# Patient Record
Sex: Male | Born: 1990 | Race: Black or African American | Hispanic: No | Marital: Single | Smoking: Never smoker
Health system: Southern US, Community
[De-identification: ages and names within clinical notes are randomized; demographics above are authoritative.]

## PROBLEM LIST (undated history)

## (undated) DIAGNOSIS — K219 Gastro-esophageal reflux disease without esophagitis: Secondary | ICD-10-CM

## (undated) DIAGNOSIS — B159 Hepatitis A without hepatic coma: Secondary | ICD-10-CM

## (undated) DIAGNOSIS — F419 Anxiety disorder, unspecified: Secondary | ICD-10-CM

## (undated) DIAGNOSIS — F329 Major depressive disorder, single episode, unspecified: Secondary | ICD-10-CM

## (undated) DIAGNOSIS — B20 Human immunodeficiency virus [HIV] disease: Secondary | ICD-10-CM

## (undated) DIAGNOSIS — F32A Depression, unspecified: Secondary | ICD-10-CM

## (undated) DIAGNOSIS — R011 Cardiac murmur, unspecified: Secondary | ICD-10-CM

## (undated) HISTORY — DX: Gastro-esophageal reflux disease without esophagitis: K21.9

## (undated) HISTORY — DX: Anxiety disorder, unspecified: F41.9

## (undated) HISTORY — PX: APPENDECTOMY: SHX54

## (undated) HISTORY — DX: Major depressive disorder, single episode, unspecified: F32.9

## (undated) HISTORY — DX: Depression, unspecified: F32.A

---

## 2017-04-18 ENCOUNTER — Encounter: Payer: Self-pay | Admitting: Emergency Medicine

## 2017-04-18 ENCOUNTER — Emergency Department
Admission: EM | Admit: 2017-04-18 | Discharge: 2017-04-18 | Disposition: A | Discharge status: Home / Self Care | Payer: Self-pay | Attending: Student in an Organized Health Care Education/Training Program | Admitting: Student in an Organized Health Care Education/Training Program

## 2017-04-18 DIAGNOSIS — K047 Periapical abscess without sinus: Secondary | ICD-10-CM | POA: Insufficient documentation

## 2017-04-18 MED ORDER — MAGIC MOUTHWASH W/LIDOCAINE: 5.0000 mL | Freq: Four times a day (QID) | ORAL | 0 refills | Status: DC

## 2017-04-18 MED ORDER — AMOXICILLIN 875 MG PO TABS: 875.0000 mg | ORAL_TABLET | Freq: Two times a day (BID) | ORAL | 0 refills | Status: DC

## 2017-04-18 NOTE — Discharge Instructions (Signed)
OPTIONS FOR DENTAL FOLLOW UP CARE ° °[place] Department of Health and Human Services - Local Safety Net Dental Clinics °http://www.ncdhhs.gov/dph/oralhealth/services/safetynetclinics.htm °  °[facility] (336-562-3123) ° °[facility] [place] (919-933-9087) ° °[facility] (919-663-1744 ext [phone]) ° °[facility] ([phone]) ° °[facility] ([phone]) °This clinic caters to the indigent population and is on a lottery system. °Location: °[facility], [address], [place] °Clinic Hours: °Wednesdays from 6pm - 9pm, patients seen by a lottery system. °For dates, call or go to [email or web address].[facility].edu/[facility]/patients/Dental-[facility] °Services: °Cleanings, fillings and simple extractions. °Payment Options: °DENTAL WORK IS FREE OF CHARGE. Bring proof of income or support. °Best way to get seen: °Arrive at 5:15 pm - this is a lottery, NOT first come/first serve, so arriving earlier will not increase your chances of being seen. °  °  °[facility] °919-537-3737 °Select option 1 for emergencies °  °Location: °[facility], [address], [place] °Clinic Hours: °No walk-ins accepted - call the day before to schedule an appointment. °Check in times are 9:30 am and 1:30 pm. °Services: °Simple extractions, temporary fillings, pulpectomy/pulp debridement, uncomplicated abscess drainage. °Payment Options: °PAYMENT IS DUE AT THE TIME OF SERVICE.  Fee is usually $100-200, additional surgical procedures (e.g. abscess drainage) may be extra. °Cash, checks, Visa/MasterCard accepted.  Can file Medicaid if patient is covered for dental - patient should call case worker to check. °No discount for [facility] patients. °Best way to get seen: °MUST call the day before and get onto the schedule. Can usually be seen the next 1-2 days. No walk-ins accepted. °  °  °[facility] °[phone] °   °Location: °[facility], [address], [place] °Clinic Hours: °M, W, Th, F 8am or 1:30pm, Tues 9a or 1:30 - first come/first served. °Services: °Simple extractions, temporary fillings, uncomplicated abscess drainage.  You do not need to be an [place] resident. °Payment Options: °PAYMENT IS DUE AT THE TIME OF SERVICE. °Dental insurance, otherwise sliding scale - bring proof of income or support. °Depending on income and treatment needed, cost is usually $50-200. °Best way to get seen: °Arrive early as it is first come/first served. °  °  °[facility] °[phone] °  °Location: °[address] °Clinic Hours: °Mon-Thu 8a-5p °Services: °Most basic dental services including extractions and fillings. °Payment Options: °PAYMENT IS DUE AT THE TIME OF SERVICE. °Sliding scale, up to 50% off - bring proof if income or support. °Medicaid with dental option accepted. °Best way to get seen: °Call to schedule an appointment, can usually be seen within 2 weeks OR they will try to see walk-ins - show up at 8a or 2p (you may have to wait). °  °  °[facility] °919-245-2435 °[place] RESIDENTS ONLY °  °Location: °[facility], [address], [place], [place] [address] °Clinic Hours: By appointment only. °Monday - Thursday 8am-5pm, Friday 8am-12pm °Services: Cleanings, fillings, extractions. °Payment Options: °PAYMENT IS DUE AT THE TIME OF SERVICE. °Cash, Visa or MasterCard. Sliding scale - $30 minimum per service. °Best way to get seen: °Come in to office, complete packet and make an appointment - need proof of income °or support monies for each household member and proof of [place] residence. °Usually takes about a month to get in. °  °  °[facility] °[phone] °  °Location: °[address]   [place] °Clinic Hours: Walk-in Urgent Care Dental Services are offered Monday-Friday  mornings only. °The numbers of emergencies accepted daily is limited to the number of °providers available. °Maximum 15 - Mondays, Wednesdays & Thursdays °Maximum 10 - Tuesdays & Fridays °Services: °You do not need to be a [place] resident to be seen for a dental emergency. °Emergencies are defined as pain, swelling, abnormal bleeding, or dental trauma. Walkins will receive x-rays if needed. °NOTE: Dental cleaning is not an emergency. °Payment Options: °PAYMENT IS DUE AT THE TIME OF SERVICE. °Minimum co-pay is $40.00 for uninsured patients. °Minimum co-pay is $3.00 for Medicaid with dental coverage. °Dental Insurance is accepted and must be presented at time of visit. °Medicare does not cover dental. °Forms of payment: Cash, credit card, checks. °Best way to get seen: °If not previously registered with the clinic, walk-in dental registration begins at 7:15 am and is on a first come/first serve basis. °If previously registered with the clinic, call to make an appointment. °  °  °[facility] °919-776-4359 °[place] RESIDENTS ONLY °  °Location: °[address], [place], [place] °Clinic Hours: °Mon-Thu 10a-2p °Services: Extractions only! °Payment Options: °FREE (donations accepted) - bring proof of income or support °Best way to get seen: °Call and schedule an appointment OR come at 8am on the 1st Monday of every month (except for holidays) when it is first come/first served. °  °  °[facility] °[phone] °  °Location: °[address], [place] °Clinic Hours: °Friday mornings °Services, Payment Options, Best way to get seen: °Call for info °

## 2017-04-18 NOTE — ED Triage Notes (Signed)
States he has a hole in his tooth which is causing him some pain

## 2017-04-18 NOTE — ED Provider Notes (Signed)
[facility] Emergency Department Provider Note  ____________________________________________  Time seen: Approximately 3:00 PM  I have reviewed the triage vital signs and the nursing notes.   HISTORY  Chief Complaint Dental Pain    HPI Jeremiah Reed is a 26 y.o. male who presents emergency department complaining of left lower dental pain. Patient reports that he has had "a hole" form along his gumline. Patient reports that this area was painful and has been increasing both in pain and size. Area is a sharp pain with eating. Patient denies any fevers or chills, difficulty breathing or swallowing. Patient does not have a dentist. No medications prior to arrival. No other complaints at this time.   History reviewed. No pertinent past medical history.  There are no active problems to display for this patient.   History reviewed. No pertinent surgical history.  Prior to Admission medications   Medication Sig Start Date End Date Taking? Authorizing Provider  amoxicillin (AMOXIL) 875 MG tablet Take 1 tablet (875 mg total) by mouth 2 (two) times daily. 04/18/17   Delorise Royals Rafael Salway, PA-C  magic mouthwash w/lidocaine SOLN Take 5 mLs by mouth 4 (four) times daily. 04/18/17   Delorise Royals Austin Pongratz, PA-C    Allergies Patient has no known allergies.  No family history on file.  Social History Social History  Substance Use Topics  . Smoking status: Never Smoker  . Smokeless tobacco: Never Used  . Alcohol use Yes     Review of Systems  Constitutional: No fever/chills Eyes: No visual changes. No discharge ENT: No upper respiratory complaints.Positive for left lower dental pain Cardiovascular: no chest pain. Respiratory: no cough. No SOB. Gastrointestinal: No abdominal pain.  No nausea, no vomiting.   Musculoskeletal: Negative for musculoskeletal pain. Skin: Negative for rash, abrasions, lacerations, ecchymosis. Neurological: Negative for headaches, focal  weakness or numbness. 10-point ROS otherwise negative.  ____________________________________________   PHYSICAL EXAM:  VITAL SIGNS: ED Triage Vitals  Enc Vitals Group     BP 04/18/17 1459 128/70     Pulse --      Resp 04/18/17 1459 20     Temp 04/18/17 1459 97 F (36.1 C)     Temp Source 04/18/17 1459 Oral     SpO2 04/18/17 1459 99 %     Weight 04/18/17 1456 165 lb (74.8 kg)     Height 04/18/17 1456 5\' 7"  (1.702 m)     Head Circumference --      Peak Flow --      Pain Score 04/18/17 1456 2     Pain Loc --      Pain Edu? --      Excl. in GC? --      Constitutional: Alert and oriented. Well appearing and in no acute distress. Eyes: Conjunctivae are normal. PERRL. EOMI. Head: Atraumatic. ENT:      Ears:       Nose: No congestion/rhinnorhea.      Mouth/Throat: Mucous membranes are moist. Erythematous left lower gumline. This surrounds the posterior molars. No drainage. No fluctuant lesion. Dentition overall in good condition. Neck: No stridor.   Hematological/Lymphatic/Immunilogical: No cervical lymphadenopathy. Cardiovascular: Normal rate, regular rhythm. Normal S1 and S2.  Good peripheral circulation. Respiratory: Normal respiratory effort without tachypnea or retractions. Lungs CTAB. Good air entry to the bases with no decreased or absent breath sounds. Musculoskeletal: Full range of motion to all extremities. No gross deformities appreciated. Neurologic:  Normal speech and language. No gross focal neurologic deficits are appreciated.  Skin:  Skin is warm, dry and intact. No rash noted. Psychiatric: Mood and affect are normal. Speech and behavior are normal. Patient exhibits appropriate insight and judgement.   ____________________________________________   LABS (all labs ordered are listed, but only abnormal results are displayed)  Labs Reviewed - No data to  display ____________________________________________  EKG   ____________________________________________  RADIOLOGY   No results found.  ____________________________________________    PROCEDURES  Procedure(s) performed:    Procedures    Medications - No data to display   ____________________________________________   INITIAL IMPRESSION / ASSESSMENT AND PLAN / ED COURSE  Pertinent labs & imaging results that were available during my care of the patient were reviewed by me and considered in my medical decision making (see chart for details).  Review of the [place] CSRS was performed in accordance of the [facility] prior to dispensing any controlled drugs.     Patient's diagnosis is consistent with dental infection. This time, no indication of abscess requiring incision and drainage. No indication for further imaging or labs.. Patient will be discharged home with prescriptions for antibiotics and Magic mouthwash. Patient is to follow up with dentist as needed or otherwise directed. Patient is given ED precautions to return to the ED for any worsening or new symptoms.     ____________________________________________  FINAL CLINICAL IMPRESSION(S) / ED DIAGNOSES  Final diagnoses:  Dental infection      NEW MEDICATIONS STARTED DURING THIS VISIT:  New Prescriptions   AMOXICILLIN (AMOXIL) 875 MG TABLET    Take 1 tablet (875 mg total) by mouth 2 (two) times daily.   MAGIC MOUTHWASH W/LIDOCAINE SOLN    Take 5 mLs by mouth 4 (four) times daily.        This chart was dictated using voice recognition software/Dragon. Despite best efforts to proofread, errors can occur which can change the meaning. Any change was purely unintentional.    Racheal PatchesJonathan D Alizandra Loh, PA-C 04/18/17 1519    Willy EddyPatrick Robinson, MD 04/18/17 [phone]

## 2017-05-08 ENCOUNTER — Emergency Department
Admission: EM | Admit: 2017-05-08 | Discharge: 2017-05-08 | Disposition: A | Discharge status: Home / Self Care | Payer: Self-pay | Attending: Emergency Medicine | Admitting: Emergency Medicine

## 2017-05-08 ENCOUNTER — Emergency Department: Payer: Self-pay

## 2017-05-08 ENCOUNTER — Encounter: Payer: Self-pay | Admitting: Emergency Medicine

## 2017-05-08 DIAGNOSIS — R079 Chest pain, unspecified: Secondary | ICD-10-CM | POA: Insufficient documentation

## 2017-05-08 LAB — CBC
HCT: 32.1 % — ABNORMAL LOW (ref 40.0–52.0)
HEMOGLOBIN: 10.8 g/dL — AB (ref 13.0–18.0)
MCH: 26.7 pg (ref 26.0–34.0)
MCHC: 33.6 g/dL (ref 32.0–36.0)
MCV: 79.6 fL — AB (ref 80.0–100.0)
PLATELETS: 147 10*3/uL — AB (ref 150–440)
RBC: 4.03 MIL/uL — AB (ref 4.40–5.90)
RDW: 15.2 % — AB (ref 11.5–14.5)
WBC: 1.9 10*3/uL — AB (ref 3.8–10.6)

## 2017-05-08 LAB — BASIC METABOLIC PANEL
Anion gap: 6 (ref 5–15)
BUN: 7 mg/dL (ref 6–20)
CALCIUM: 8.4 mg/dL — AB (ref 8.9–10.3)
CO2: 27 mmol/L (ref 22–32)
Chloride: 106 mmol/L (ref 101–111)
Creatinine, Ser: 0.66 mg/dL (ref 0.61–1.24)
GFR calc Af Amer: >60 mL/min (ref >60)
GLUCOSE: 81 mg/dL (ref 65–99)
Potassium: 3.2 mmol/L — ABNORMAL LOW (ref 3.5–5.1)
SODIUM: 139 mmol/L (ref 135–145)

## 2017-05-08 LAB — TROPONIN I: Troponin I: <0.03 ng/mL (ref <0.03)

## 2017-05-08 MED ORDER — GI COCKTAIL ~~LOC~~
30.0000 mL | Freq: Once | ORAL | Status: AC
  Administered 2017-05-08: 30 mL via ORAL
  Filled 2017-05-08: qty 30

## 2017-05-08 MED ORDER — FAMOTIDINE 20 MG PO TABS: 20.0000 mg | ORAL_TABLET | Freq: Every day | ORAL | 0 refills | Status: DC

## 2017-05-08 MED ORDER — TRAMADOL HCL 50 MG PO TABS
50.0000 mg | ORAL_TABLET | Freq: Once | ORAL | Status: AC
  Administered 2017-05-08: 50 mg via ORAL
  Filled 2017-05-08: qty 1

## 2017-05-08 NOTE — ED Triage Notes (Signed)
Pt in with co chest pain for a month, states it is intermittent. Gets shob at times, none noted at this time.

## 2017-05-08 NOTE — ED Notes (Signed)
Lavender tube sent down by Clinton SawyerKailey, RN.

## 2017-05-08 NOTE — ED Notes (Signed)
Lab called and notified of need of results. States they need an additional lavender tube.

## 2017-05-08 NOTE — ED Provider Notes (Signed)
[facility] Emergency Department Provider Note   ____________________________________________   First MD Initiated Contact with Patient 05/08/17 0413     (approximate)  I have reviewed the triage vital signs and the nursing notes.   HISTORY  Chief Complaint Chest Pain    HPI Jeremiah Reed is a 26 y.o. male who comes into the hospital today with chest pain. The patient reports that he has been having them for a few weeks. He reports that tonight though the pain was 7 out of 10 is a typical 3. The patient hasn't taken anything for pain. He reports that nothing seems to bring the pain on. He eats for dinner tonight. He says that he feels as though a ball is trying to push out of his chest but denies any burning pain. The patient denies shortness of breath tonight but he has had shortness of breath with the pain in the past. The patient denies pleuritic nature to his pain. He says that he's had some sweats but no nausea and vomiting and no dizziness or lightheadedness. The patient denies any radiation of the pain. He has not been evaluated for this pain in the past. He decided to come into the hospital today for evaluation.   History reviewed. No pertinent past medical history.  There are no active problems to display for this patient.   Past Surgical History:  Procedure Laterality Date  . APPENDECTOMY      Prior to Admission medications   Medication Sig Start Date End Date Taking? Authorizing Provider  amoxicillin (AMOXIL) 875 MG tablet Take 1 tablet (875 mg total) by mouth 2 (two) times daily. Patient not taking: Reported on 05/08/2017 04/18/17   Cuthriell, Delorise Royals, PA-C  famotidine (PEPCID) 20 MG tablet Take 1 tablet (20 mg total) by mouth daily. 05/08/17 05/08/18  Rebecka Apley, MD  magic mouthwash w/lidocaine SOLN Take 5 mLs by mouth 4 (four) times daily. Patient not taking: Reported on 05/08/2017 04/18/17   Cuthriell, Delorise Royals, PA-C     Allergies Patient has no known allergies.  No family history on file.  Social History Social History  Substance Use Topics  . Smoking status: Never Smoker  . Smokeless tobacco: Never Used  . Alcohol use Yes    Review of Systems  Constitutional: No fever/chills Eyes: No visual changes. ENT: No sore throat. Cardiovascular: chest pain. Respiratory: Denies shortness of breath. Gastrointestinal: No abdominal pain.  No nausea, no vomiting.  No diarrhea.  No constipation. Genitourinary: Negative for dysuria. Musculoskeletal: Negative for back pain. Skin: Negative for rash. Neurological: Negative for headaches, focal weakness or numbness.   ____________________________________________   PHYSICAL EXAM:  VITAL SIGNS: ED Triage Vitals [05/08/17 0315]  Enc Vitals Group     BP 131/80     Pulse Rate (!) 101     Resp 18     Temp 99.3 F (37.4 C)     Temp Source Oral     SpO2 100 %     Weight 153 lb (69.4 kg)     Height 5\' 7"  (1.702 m)     Head Circumference      Peak Flow      Pain Score 2     Pain Loc      Pain Edu?      Excl. in GC?     Constitutional: Alert and oriented. Well appearing and in Mild distress. Eyes: Conjunctivae are normal. PERRL. EOMI. Head: Atraumatic. Nose: No congestion/rhinnorhea. Mouth/Throat: Mucous membranes  are moist.  Oropharynx non-erythematous. Cardiovascular: Normal rate, regular rhythm. Grossly normal heart sounds.  Good peripheral circulation. Respiratory: Normal respiratory effort.  No retractions. Lungs CTAB. Gastrointestinal: Soft and nontender. No distention. Positive bowel sounds Musculoskeletal: No lower extremity tenderness nor edema.   Neurologic:  Normal speech and language.  Skin:  Skin is warm, dry and intact.  Psychiatric: Mood and affect are normal.   ____________________________________________   LABS (all labs ordered are listed, but only abnormal results are displayed)  Labs Reviewed  CBC - Abnormal; Notable  for the following:       Result Value   WBC 1.9 (*)    RBC 4.03 (*)    Hemoglobin 10.8 (*)    HCT 32.1 (*)    MCV 79.6 (*)    RDW 15.2 (*)    Platelets 147 (*)    All other components within normal limits  TROPONIN I  TROPONIN I  BASIC METABOLIC PANEL   ____________________________________________  EKG  ED ECG REPORT I, Rebecka ApleyWebster,  Allison P, the attending physician, personally viewed and interpreted this ECG.   Date: 05/08/2017  EKG Time: 325  Rate: 79  Rhythm: normal sinus rhythm  Axis: normal  Intervals:none  ST&T Change: none  ____________________________________________  RADIOLOGY  CXR ____________________________________________   PROCEDURES  Procedure(s) performed: None  Procedures  Critical Care performed: No  ____________________________________________   INITIAL IMPRESSION / ASSESSMENT AND PLAN / ED COURSE  Pertinent labs & imaging results that were available during my care of the patient were reviewed by me and considered in my medical decision making (see chart for details).  This is a 26 year old male who comes into the hospital today with chest pain. The patient reports that he has been having chest pain intermittently for about 3 weeks. He did have some pizza earlier today so I will give him a GI cocktail as well as some tramadol. I will await the results of the patient's blood work and then reassess the patient once I received the results. The patient does not have a pleuritic chest pain side will not perform a d-dimer at this time.  Clinical Course as of May 08 810  Wed May 08, 2017  0413 Mild peribronchial thickening noted.  Lungs otherwise clear. DG Chest 2 View [AW]    Clinical Course User Index [AW] Rebecka ApleyWebster, Allison P, MD   The patient's pain is improved after the GI cocktail and tramadol. The patient does have white cell count of 1.9 but I feel that may be at the patient's baseline. He should follow-up with the acute care clinic. The  patient will be discharged home. I feel that his symptoms may be due to reflux especially given the fact that he had some pizza before the symptoms started. He will be discharged with some Pepcid for home.  ____________________________________________   FINAL CLINICAL IMPRESSION(S) / ED DIAGNOSES  Final diagnoses:  Chest pain, unspecified type      NEW MEDICATIONS STARTED DURING THIS VISIT:  New Prescriptions   FAMOTIDINE (PEPCID) 20 MG TABLET    Take 1 tablet (20 mg total) by mouth daily.     Note:  This document was prepared using Dragon voice recognition software and may include unintentional dictation errors.    Rebecka ApleyWebster, Allison P, MD 05/08/17 [phone]

## 2017-05-08 NOTE — Discharge Instructions (Signed)
PLease follow up with the acute care clinic

## 2017-05-08 NOTE — ED Notes (Signed)
Lab called and notified of need of results. Lab states they need an additional light green tube. MD and charge RN notified.

## 2017-06-05 ENCOUNTER — Emergency Department
Admission: EM | Admit: 2017-06-05 | Discharge: 2017-06-05 | Disposition: A | Discharge status: Home / Self Care | Payer: Self-pay | Attending: Emergency Medicine | Admitting: Emergency Medicine

## 2017-06-05 ENCOUNTER — Encounter: Payer: Self-pay | Admitting: Emergency Medicine

## 2017-06-05 ENCOUNTER — Emergency Department: Payer: Self-pay

## 2017-06-05 DIAGNOSIS — K5900 Constipation, unspecified: Secondary | ICD-10-CM | POA: Insufficient documentation

## 2017-06-05 DIAGNOSIS — R1084 Generalized abdominal pain: Secondary | ICD-10-CM | POA: Insufficient documentation

## 2017-06-05 HISTORY — DX: Cardiac murmur, unspecified: R01.1

## 2017-06-05 LAB — COMPREHENSIVE METABOLIC PANEL
ALK PHOS: 70 U/L (ref 38–126)
ALT: 29 U/L (ref 17–63)
ANION GAP: 8 (ref 5–15)
AST: 39 U/L (ref 15–41)
Albumin: 3.3 g/dL — ABNORMAL LOW (ref 3.5–5.0)
BUN: 13 mg/dL (ref 6–20)
CALCIUM: 8.6 mg/dL — AB (ref 8.9–10.3)
CHLORIDE: 105 mmol/L (ref 101–111)
CO2: 26 mmol/L (ref 22–32)
CREATININE: 0.68 mg/dL (ref 0.61–1.24)
Glucose, Bld: 92 mg/dL (ref 65–99)
Potassium: 3.9 mmol/L (ref 3.5–5.1)
SODIUM: 139 mmol/L (ref 135–145)
Total Bilirubin: 0.7 mg/dL (ref 0.3–1.2)
Total Protein: 7.4 g/dL (ref 6.5–8.1)

## 2017-06-05 LAB — CBC
HCT: 31 % — ABNORMAL LOW (ref 40.0–52.0)
HEMOGLOBIN: 10.2 g/dL — AB (ref 13.0–18.0)
MCH: 25.8 pg — ABNORMAL LOW (ref 26.0–34.0)
MCHC: 33 g/dL (ref 32.0–36.0)
MCV: 77.9 fL — ABNORMAL LOW (ref 80.0–100.0)
PLATELETS: 203 10*3/uL (ref 150–440)
RBC: 3.98 MIL/uL — AB (ref 4.40–5.90)
RDW: 15.6 % — ABNORMAL HIGH (ref 11.5–14.5)
WBC: 4.9 10*3/uL (ref 3.8–10.6)

## 2017-06-05 LAB — LIPASE, BLOOD: LIPASE: 23 U/L (ref 11–51)

## 2017-06-05 MED ORDER — SENNOSIDES-DOCUSATE SODIUM 8.6-50 MG PO TABS: 2.0000 | ORAL_TABLET | Freq: Two times a day (BID) | ORAL | 0 refills | Status: AC

## 2017-06-05 MED ORDER — MAGNESIUM CITRATE PO SOLN
1.0000 | Freq: Once | ORAL | Status: AC
  Administered 2017-06-05: 1 via ORAL
  Filled 2017-06-05: qty 296

## 2017-06-05 NOTE — ED Provider Notes (Signed)
[facility] Emergency Department Provider Note  ____________________________________________  Time seen: Approximately 5:49 AM  I have reviewed the triage vital signs and the nursing notes.   HISTORY  Chief Complaint Constipation and Abdominal Pain    HPI Jeremiah Reed is a 25 y.o. male who complains of constipation for the past week. No bowel movements during this time. No vomiting. No nausea. Today has generalized abdominal pain. Has a history of appendectomy but no other abdominal surgeries. No history of hernia. Pain is constant but waxing and waning. Nonradiating. No aggravating or alleviating factors     Past Medical History:  Diagnosis Date  . Heart murmur      There are no active problems to display for this patient.    Past Surgical History:  Procedure Laterality Date  . APPENDECTOMY       Prior to Admission medications   Medication Sig Start Date End Date Taking? Authorizing Provider  amoxicillin (AMOXIL) 875 MG tablet Take 1 tablet (875 mg total) by mouth 2 (two) times daily. Patient not taking: Reported on 05/08/2017 04/18/17   Cuthriell, Delorise Royals, PA-C  famotidine (PEPCID) 20 MG tablet Take 1 tablet (20 mg total) by mouth daily. Patient not taking: Reported on 06/05/2017 05/08/17 05/08/18  Rebecka Apley, MD  magic mouthwash w/lidocaine SOLN Take 5 mLs by mouth 4 (four) times daily. Patient not taking: Reported on 05/08/2017 04/18/17   Cuthriell, Delorise Royals, PA-C  senna-docusate (SENOKOT-S) 8.6-50 MG tablet Take 2 tablets by mouth 2 (two) times daily. 06/05/17   Sharman Cheek, MD     Allergies Patient has no known allergies.   No family history on file.  Social History Social History  Substance Use Topics  . Smoking status: Never Smoker  . Smokeless tobacco: Never Used  . Alcohol use Yes    Review of Systems  Constitutional:   No fever or chills.  ENT:   No sore throat. No rhinorrhea. Cardiovascular:   No chest  pain or syncope. Respiratory:   No dyspnea or cough. Gastrointestinal:   Abdominal pain as above without vomiting and diarrhea. Positive constipation Musculoskeletal:   Negative for focal pain or swelling All other systems reviewed and are negative except as documented above in ROS and HPI.  ____________________________________________   PHYSICAL EXAM:  VITAL SIGNS: ED Triage Vitals  Enc Vitals Group     BP 06/05/17 0126 124/84     Pulse Rate 06/05/17 0126 (!) 114     Resp 06/05/17 0126 18     Temp 06/05/17 0126 99.9 F (37.7 C)     Temp Source 06/05/17 0126 Oral     SpO2 06/05/17 0126 97 %     Weight 06/05/17 0125 153 lb (69.4 kg)     Height 06/05/17 0125 5\' 7"  (1.702 m)     Head Circumference --      Peak Flow --      Pain Score 06/05/17 0125 5     Pain Loc --      Pain Edu? --      Excl. in GC? --     Vital signs reviewed, nursing assessments reviewed.   Constitutional:   Alert and oriented. Well appearing and in no distress. Eyes:   No scleral icterus.  EOMI. No nystagmus. No conjunctival pallor. PERRL. ENT   Head:   Normocephalic and atraumatic.   Nose:   No congestion/rhinnorhea.    Mouth/Throat:   MMM, no pharyngeal erythema. No peritonsillar mass.  Neck:   No meningismus. Full ROM Hematological/Lymphatic/Immunilogical:   No cervical lymphadenopathy. Cardiovascular:   RRR. Symmetric bilateral radial and DP pulses.  No murmurs.  Respiratory:   Normal respiratory effort without tachypnea/retractions. Breath sounds are clear and equal bilaterally. No wheezes/rales/rhonchi. Gastrointestinal:   Soft and nontender. Non distended. There is no CVA tenderness.  No rebound, rigidity, or guarding. No hernias. Rectal exam reveals no stool in the vault. Genitourinary:   Unremarkable. No hernias Musculoskeletal:   Normal range of motion in all extremities. No joint effusions.  No lower extremity tenderness.  No edema. Neurologic:   Normal speech and language.   Motor grossly intact. No gross focal neurologic deficits are appreciated.  Skin:    Skin is warm, dry and intact. No rash noted.  No petechiae, purpura, or bullae.  ____________________________________________    LABS (pertinent positives/negatives) (all labs ordered are listed, but only abnormal results are displayed) Labs Reviewed  COMPREHENSIVE METABOLIC PANEL - Abnormal; Notable for the following:       Result Value   Calcium 8.6 (*)    Albumin 3.3 (*)    All other components within normal limits  CBC - Abnormal; Notable for the following:    RBC 3.98 (*)    Hemoglobin 10.2 (*)    HCT 31.0 (*)    MCV 77.9 (*)    MCH 25.8 (*)    RDW 15.6 (*)    All other components within normal limits  LIPASE, BLOOD  URINALYSIS, COMPLETE (UACMP) WITH MICROSCOPIC   ____________________________________________   EKG    ____________________________________________    RADIOLOGY  Dg Abdomen 1 View  Result Date: 06/05/2017 CLINICAL DATA:  Constipation for 1 week.  Abdominal pain and nausea. EXAM: ABDOMEN - 1 VIEW COMPARISON:  None. FINDINGS: No dilated bowel loops to suggest obstruction. Moderate stool throughout the entire colon with stool distending the rectum. Sutures in the right mid abdomen likely sequela of prior appendectomy. No evidence of free air on the supine view. No radiopaque calculi. No osseous abnormalities. IMPRESSION: Moderate colonic stool burden with stool distending the rectum, suggesting constipation. No evidence of bowel obstruction. Electronically Signed   By: Rubye OaksMelanie  Ehinger M.D.   On: 06/05/2017 01:45    ____________________________________________   PROCEDURES Procedures  ____________________________________________   INITIAL IMPRESSION / ASSESSMENT AND PLAN / ED COURSE  Pertinent labs & imaging results that were available during my care of the patient were reviewed by me and considered in my medical decision making (see chart for details).  Patient  well appearing no acute distress, normal vital signs. Initially had tachycardia in triage now resolved on my exam. Presentation consistent with constipation, unknown etiology. Tried MiraLAX at home without relief. No lower rectal stool ball to allow for manual disimpaction. We'll give magnesium citrate, follow up with primary care.Considering the patient's symptoms, medical history, and physical examination today, I have low suspicion for cholecystitis or biliary pathology, pancreatitis, perforation or bowel obstruction, hernia, intra-abdominal abscess, AAA or dissection, volvulus or intussusception, mesenteric ischemia, or appendicitis.        ____________________________________________   FINAL CLINICAL IMPRESSION(S) / ED DIAGNOSES  Final diagnoses:  Constipation, unspecified constipation type  Generalized abdominal pain      New Prescriptions   SENNA-DOCUSATE (SENOKOT-S) 8.6-50 MG TABLET    Take 2 tablets by mouth 2 (two) times daily.     Portions of this note were generated with dragon dictation software. Dictation errors may occur despite best attempts at proofreading.    Sharman CheekStafford, Alle Difabio,  MD 06/05/17 1610

## 2017-06-05 NOTE — ED Triage Notes (Signed)
Patient ambulatory to triage with steady gait, without difficulty or distress noted; pt reports constipation since last Wed, unrelieved by miralax; now with abd pain with nausea

## 2017-06-05 NOTE — Discharge Instructions (Signed)
Results for orders placed or performed during the hospital encounter of 06/05/17  Lipase, blood  Result Value Ref Range   Lipase 23 11 - 51 U/L  Comprehensive metabolic panel  Result Value Ref Range   Sodium 139 135 - 145 mmol/L   Potassium 3.9 3.5 - 5.1 mmol/L   Chloride 105 101 - 111 mmol/L   CO2 26 22 - 32 mmol/L   Glucose, Bld 92 65 - 99 mg/dL   BUN 13 6 - 20 mg/dL   Creatinine, Ser 3.080.68 0.61 - 1.24 mg/dL   Calcium 8.6 (L) 8.9 - 10.3 mg/dL   Total Protein 7.4 6.5 - 8.1 g/dL   Albumin 3.3 (L) 3.5 - 5.0 g/dL   AST 39 15 - 41 U/L   ALT 29 17 - 63 U/L   Alkaline Phosphatase 70 38 - 126 U/L   Total Bilirubin 0.7 0.3 - 1.2 mg/dL   GFR calc non Af Amer >60 >60 mL/min   GFR calc Af Amer >60 >60 mL/min   Anion gap 8 5 - 15  CBC  Result Value Ref Range   WBC 4.9 3.8 - 10.6 K/uL   RBC 3.98 (L) 4.40 - 5.90 MIL/uL   Hemoglobin 10.2 (L) 13.0 - 18.0 g/dL   HCT 65.731.0 (L) 84.640.0 - 96.252.0 %   MCV 77.9 (L) 80.0 - 100.0 fL   MCH 25.8 (L) 26.0 - 34.0 pg   MCHC 33.0 32.0 - 36.0 g/dL   RDW 95.215.6 (H) 84.111.5 - 32.414.5 %   Platelets 203 150 - 440 K/uL   Dg Chest 2 View  Result Date: 05/08/2017 CLINICAL DATA:  Chronic intermittent generalized chest pain and shortness of breath. EXAM: CHEST  2 VIEW COMPARISON:  None. FINDINGS: The lungs are well-aerated. Mild peribronchial thickening is noted. There is no evidence of focal opacification, pleural effusion or pneumothorax. The heart is normal in size; the mediastinal contour is within normal limits. No acute osseous abnormalities are seen. IMPRESSION: Mild peribronchial thickening noted.  Lungs otherwise clear. Electronically Signed   By: Roanna RaiderJeffery  Chang M.D.   On: 05/08/2017 03:58   Dg Abdomen 1 View  Result Date: 06/05/2017 CLINICAL DATA:  Constipation for 1 week.  Abdominal pain and nausea. EXAM: ABDOMEN - 1 VIEW COMPARISON:  None. FINDINGS: No dilated bowel loops to suggest obstruction. Moderate stool throughout the entire colon with stool distending the  rectum. Sutures in the right mid abdomen likely sequela of prior appendectomy. No evidence of free air on the supine view. No radiopaque calculi. No osseous abnormalities. IMPRESSION: Moderate colonic stool burden with stool distending the rectum, suggesting constipation. No evidence of bowel obstruction. Electronically Signed   By: Rubye OaksMelanie  Ehinger M.D.   On: 06/05/2017 01:45

## 2017-07-16 ENCOUNTER — Emergency Department: Payer: Self-pay

## 2017-07-16 ENCOUNTER — Encounter: Payer: Self-pay | Admitting: Emergency Medicine

## 2017-07-16 ENCOUNTER — Inpatient Hospital Stay
Admission: EM | Admit: 2017-07-16 | Discharge: 2017-07-20 | DRG: 976 | Disposition: A | Discharge status: Home / Self Care | Payer: Self-pay | Attending: Internal Medicine | Admitting: Internal Medicine

## 2017-07-16 DIAGNOSIS — J181 Lobar pneumonia, unspecified organism: Secondary | ICD-10-CM | POA: Diagnosis present

## 2017-07-16 DIAGNOSIS — A419 Sepsis, unspecified organism: Principal | ICD-10-CM | POA: Diagnosis present

## 2017-07-16 DIAGNOSIS — J189 Pneumonia, unspecified organism: Secondary | ICD-10-CM

## 2017-07-16 DIAGNOSIS — K5900 Constipation, unspecified: Secondary | ICD-10-CM | POA: Diagnosis not present

## 2017-07-16 DIAGNOSIS — Z79899 Other long term (current) drug therapy: Secondary | ICD-10-CM

## 2017-07-16 DIAGNOSIS — E876 Hypokalemia: Secondary | ICD-10-CM | POA: Diagnosis present

## 2017-07-16 DIAGNOSIS — B20 Human immunodeficiency virus [HIV] disease: Secondary | ICD-10-CM | POA: Diagnosis present

## 2017-07-16 DIAGNOSIS — D509 Iron deficiency anemia, unspecified: Secondary | ICD-10-CM | POA: Diagnosis present

## 2017-07-16 DIAGNOSIS — R1011 Right upper quadrant pain: Secondary | ICD-10-CM

## 2017-07-16 DIAGNOSIS — B370 Candidal stomatitis: Secondary | ICD-10-CM | POA: Diagnosis present

## 2017-07-16 LAB — COMPREHENSIVE METABOLIC PANEL
ALK PHOS: 46 U/L (ref 38–126)
ALT: 31 U/L (ref 17–63)
ANION GAP: 8 (ref 5–15)
AST: 50 U/L — ABNORMAL HIGH (ref 15–41)
Albumin: 3.6 g/dL (ref 3.5–5.0)
BUN: 9 mg/dL (ref 6–20)
CO2: 26 mmol/L (ref 22–32)
Calcium: 8.8 mg/dL — ABNORMAL LOW (ref 8.9–10.3)
Chloride: 104 mmol/L (ref 101–111)
Creatinine, Ser: 0.85 mg/dL (ref 0.61–1.24)
GFR calc non Af Amer: >60 mL/min (ref >60)
Glucose, Bld: 83 mg/dL (ref 65–99)
Potassium: 3.3 mmol/L — ABNORMAL LOW (ref 3.5–5.1)
SODIUM: 138 mmol/L (ref 135–145)
Total Bilirubin: 0.7 mg/dL (ref 0.3–1.2)
Total Protein: 7.8 g/dL (ref 6.5–8.1)

## 2017-07-16 LAB — URINALYSIS, COMPLETE (UACMP) WITH MICROSCOPIC
Bacteria, UA: NONE SEEN
Bilirubin Urine: NEGATIVE
GLUCOSE, UA: NEGATIVE mg/dL
HGB URINE DIPSTICK: NEGATIVE
Ketones, ur: 5 mg/dL — AB
Leukocytes, UA: NEGATIVE
NITRITE: NEGATIVE
PH: 6 (ref 5.0–8.0)
PROTEIN: 30 mg/dL — AB
SPECIFIC GRAVITY, URINE: 1.012 (ref 1.005–1.030)
Squamous Epithelial / LPF: NONE SEEN

## 2017-07-16 LAB — LACTIC ACID, PLASMA
LACTIC ACID, VENOUS: 0.7 mmol/L (ref 0.5–1.9)
LACTIC ACID, VENOUS: 1 mmol/L (ref 0.5–1.9)

## 2017-07-16 LAB — RETICULOCYTES
RBC.: 3.43 MIL/uL — AB (ref 4.40–5.90)
RETIC COUNT ABSOLUTE: 24 10*3/uL (ref 19.0–183.0)
RETIC CT PCT: 0.7 % (ref 0.4–3.1)

## 2017-07-16 LAB — IRON AND TIBC
Iron: 13 ug/dL — ABNORMAL LOW (ref 45–182)
SATURATION RATIOS: 7 % — AB (ref 17.9–39.5)
TIBC: 185 ug/dL — ABNORMAL LOW (ref 250–450)
UIBC: 172 ug/dL

## 2017-07-16 LAB — CBC WITH DIFFERENTIAL/PLATELET
Basophils Absolute: 0 10*3/uL (ref 0–0.1)
Basophils Relative: 0 %
EOS ABS: 0 10*3/uL (ref 0–0.7)
EOS PCT: 0 %
HCT: 29.4 % — ABNORMAL LOW (ref 40.0–52.0)
HEMOGLOBIN: 9.8 g/dL — AB (ref 13.0–18.0)
LYMPHS ABS: 0.7 10*3/uL — AB (ref 1.0–3.6)
Lymphocytes Relative: 21 %
MCH: 27 pg (ref 26.0–34.0)
MCHC: 33.4 g/dL (ref 32.0–36.0)
MCV: 80.8 fL (ref 80.0–100.0)
MONO ABS: 0.4 10*3/uL (ref 0.2–1.0)
MONOS PCT: 11 %
Neutro Abs: 2.4 10*3/uL (ref 1.4–6.5)
Neutrophils Relative %: 68 %
PLATELETS: 275 10*3/uL (ref 150–440)
RBC: 3.64 MIL/uL — ABNORMAL LOW (ref 4.40–5.90)
RDW: 16.9 % — ABNORMAL HIGH (ref 11.5–14.5)
WBC: 3.6 10*3/uL — ABNORMAL LOW (ref 3.8–10.6)

## 2017-07-16 LAB — LIPASE, BLOOD: Lipase: 25 U/L (ref 11–51)

## 2017-07-16 LAB — FIBRIN DERIVATIVES D-DIMER (ARMC ONLY): Fibrin derivatives D-dimer (ARMC): 1151.05 — ABNORMAL HIGH (ref 0.00–499.00)

## 2017-07-16 LAB — FOLATE: FOLATE: 22 ng/mL (ref >5.9)

## 2017-07-16 LAB — VITAMIN B12: Vitamin B-12: 688 pg/mL (ref 180–914)

## 2017-07-16 LAB — FERRITIN: Ferritin: 2305 ng/mL — ABNORMAL HIGH (ref 24–336)

## 2017-07-16 LAB — TROPONIN I: Troponin I: <0.03 ng/mL (ref <0.03)

## 2017-07-16 MED ORDER — ACETAMINOPHEN 650 MG RE SUPP: 650.0000 mg | Freq: Four times a day (QID) | RECTAL | Status: DC | PRN

## 2017-07-16 MED ORDER — SENNOSIDES-DOCUSATE SODIUM 8.6-50 MG PO TABS
1.0000 | ORAL_TABLET | Freq: Every evening | ORAL | Status: DC | PRN
  Administered 2017-07-19 – 2017-07-20 (×2): 1 via ORAL
  Filled 2017-07-16 (×2): qty 1

## 2017-07-16 MED ORDER — SODIUM CHLORIDE 0.9 % IV BOLUS (SEPSIS)
1000.0000 mL | Freq: Once | INTRAVENOUS | Status: AC
  Administered 2017-07-16: 1000 mL via INTRAVENOUS

## 2017-07-16 MED ORDER — ONDANSETRON HCL 4 MG/2ML IJ SOLN
4.0000 mg | Freq: Four times a day (QID) | INTRAMUSCULAR | Status: DC | PRN
  Administered 2017-07-16 – 2017-07-19 (×2): 4 mg via INTRAVENOUS
  Filled 2017-07-16 (×3): qty 2

## 2017-07-16 MED ORDER — DEXTROSE 5 % IV SOLN
500.0000 mg | INTRAVENOUS | Status: DC
  Administered 2017-07-17: 11:00:00 500 mg via INTRAVENOUS
  Filled 2017-07-16: qty 500

## 2017-07-16 MED ORDER — DEXTROSE 5 % IV SOLN
1.0000 g | Freq: Once | INTRAVENOUS | Status: AC
  Administered 2017-07-16: 1 g via INTRAVENOUS
  Filled 2017-07-16: qty 10

## 2017-07-16 MED ORDER — ENOXAPARIN SODIUM 40 MG/0.4ML ~~LOC~~ SOLN
40.0000 mg | SUBCUTANEOUS | Status: DC
  Administered 2017-07-17 – 2017-07-19 (×3): 40 mg via SUBCUTANEOUS
  Filled 2017-07-16 (×3): qty 0.4

## 2017-07-16 MED ORDER — CEFTRIAXONE SODIUM-DEXTROSE 1-3.74 GM-% IV SOLR
1.0000 g | INTRAVENOUS | Status: DC
  Filled 2017-07-16: qty 50

## 2017-07-16 MED ORDER — SODIUM CHLORIDE 0.9 % IV SOLN
INTRAVENOUS | Status: DC
  Administered 2017-07-16 – 2017-07-17 (×3): via INTRAVENOUS

## 2017-07-16 MED ORDER — DEXTROSE 5 % IV SOLN
500.0000 mg | Freq: Once | INTRAVENOUS | Status: AC
  Administered 2017-07-16: 500 mg via INTRAVENOUS
  Filled 2017-07-16: qty 500

## 2017-07-16 MED ORDER — HYDROCODONE-ACETAMINOPHEN 5-325 MG PO TABS: 1.0000 | ORAL_TABLET | ORAL | Status: DC | PRN

## 2017-07-16 MED ORDER — ACETAMINOPHEN 325 MG PO TABS
650.0000 mg | ORAL_TABLET | Freq: Four times a day (QID) | ORAL | Status: DC | PRN
  Administered 2017-07-16 – 2017-07-17 (×2): 650 mg via ORAL
  Filled 2017-07-16 (×2): qty 2

## 2017-07-16 MED ORDER — IOPAMIDOL (ISOVUE-370) INJECTION 76%
75.0000 mL | Freq: Once | INTRAVENOUS | Status: AC | PRN
  Administered 2017-07-16: 75 mL via INTRAVENOUS

## 2017-07-16 MED ORDER — POTASSIUM CHLORIDE CRYS ER 20 MEQ PO TBCR
40.0000 meq | EXTENDED_RELEASE_TABLET | Freq: Once | ORAL | Status: AC
  Administered 2017-07-16: 40 meq via ORAL
  Filled 2017-07-16: qty 2

## 2017-07-16 MED ORDER — ONDANSETRON HCL 4 MG PO TABS: 4.0000 mg | ORAL_TABLET | Freq: Four times a day (QID) | ORAL | Status: DC | PRN

## 2017-07-16 MED ORDER — DEXTROSE 5 % IV SOLN
1.0000 g | INTRAVENOUS | Status: DC
  Administered 2017-07-17 – 2017-07-19 (×3): 1 g via INTRAVENOUS
  Filled 2017-07-16 (×4): qty 10

## 2017-07-16 NOTE — Progress Notes (Signed)
Pharmacy Antibiotic Note  Jeremiah SaltsKyree Reed is a 26 y.o. male admitted on 07/16/2017 with PNA.  Pharmacy has been consulted for ceftriaxone and azithromycin dosing.  Plan: 1. Ceftriaxone 1 gm IV Q24H 2. Azithromycin 500 mg IV Q24H  Weight: 153 lb (69.4 kg)  Temp (24hrs), Avg:99.5 F (37.5 C), Min:98.9 F (37.2 C), Max:100.1 F (37.8 C)   Recent Labs Lab 07/16/17 0743  WBC 3.6*  CREATININE 0.85    Estimated Creatinine Clearance: 123.1 mL/min (by C-G formula based on SCr of 0.85 mg/dL).    No Known Allergies   Thank you for allowing pharmacy to be a part of this patient's care.  Carola FrostNathan A Deiondra Denley, Pharm.D., BCPS Clinical Pharmacist 07/16/2017 12:33 PM

## 2017-07-16 NOTE — ED Notes (Signed)
Blue tube collected and sent to lab.  ?

## 2017-07-16 NOTE — H&P (Signed)
[facility]   PATIENT NAME: Jeremiah SaltsKyree Code    MR#:  [id number]  DATE OF BIRTH:  02/18/1991  DATE OF ADMISSION:  07/16/2017  PRIMARY CARE PHYSICIAN: Patient, No Pcp Per   REQUESTING/REFERRING PHYSICIAN: dr Langston Maskershaevitz  CHIEF COMPLAINT:   Right sided chest pain HISTORY OF PRESENT ILLNESS:  Jeremiah Reed  is a 26 y.o. male with No past medical history presents with above complaint. Patient reports that he was on a Z-Pak approximately 2-1/2 weeks ago for acute bronchitis. He felt better for about a week. Over the past week and a half he has had increasing shortness of breath, dyspnea exertion, fever and chills along with a cough. He comes to the emergency room today because he was having right-sided chest pain. He denies pain on deep inspiration. Chest x-ray shows bilateral pneumonia. He is started on Rocephin and azithromycin.  PAST MEDICAL HISTORY:   Past Medical History:  Diagnosis Date  . Heart murmur     PAST SURGICAL HISTORY:   Past Surgical History:  Procedure Laterality Date  . APPENDECTOMY      SOCIAL HISTORY:   Social History  Substance Use Topics  . Smoking status: Never Smoker  . Smokeless tobacco: Never Used  . Alcohol use Yes    FAMILY HISTORY:  No history of CAD, hypertension and diabetes  DRUG ALLERGIES:  No Known Allergies  REVIEW OF SYSTEMS:   Review of Systems  Constitutional: Negative for chills, fever and malaise/fatigue.  HENT: Negative.  Negative for ear discharge, ear pain, hearing loss, nosebleeds and sore throat.   Eyes: Negative.  Negative for blurred vision and pain.  Respiratory: Positive for cough and shortness of breath. Negative for hemoptysis and wheezing.   Cardiovascular: Positive for chest pain. Negative for palpitations and leg swelling.  Gastrointestinal: Negative.  Negative for abdominal pain, blood in stool, diarrhea, nausea and vomiting.  Genitourinary: Negative.  Negative for dysuria.   Musculoskeletal: Negative.  Negative for back pain.  Skin: Negative.   Neurological: Positive for weakness. Negative for dizziness, tremors, speech change, focal weakness, seizures and headaches.  Endo/Heme/Allergies: Negative.  Does not bruise/bleed easily.  Psychiatric/Behavioral: Negative.  Negative for depression, hallucinations and suicidal ideas.    MEDICATIONS AT HOME:   Prior to Admission medications   Medication Sig Start Date End Date Taking? Authorizing Provider  senna-docusate (SENOKOT-S) 8.6-50 MG tablet Take 2 tablets by mouth 2 (two) times daily. 06/05/17  Yes Sharman CheekStafford, Phillip, MD  VENTOLIN HFA 108 [phone](90 Base) MCG/ACT inhaler Inhale 2 puffs into the lungs every 4 (four) hours as needed. 06/15/17  Yes [provider]  famotidine (PEPCID) 20 MG tablet Take 1 tablet (20 mg total) by mouth daily. Patient not taking: Reported on 06/05/2017 05/08/17 05/08/18  Rebecka ApleyWebster, Allison P, MD  magic mouthwash w/lidocaine SOLN Take 5 mLs by mouth 4 (four) times daily. Patient not taking: Reported on 05/08/2017 04/18/17   Cuthriell, Delorise RoyalsJonathan D, PA-C      VITAL SIGNS:  Blood pressure 129/73, pulse (!) 119, temperature 98.9 F (37.2 C), temperature source Oral, resp. rate 20, weight 69.4 kg (153 lb), SpO2 99 %.  PHYSICAL EXAMINATION:   Physical Exam  Constitutional: He is oriented to person, place, and time and well-developed, well-nourished, and in no distress. No distress.  HENT:  Head: Normocephalic.  Eyes: No scleral icterus.  Neck: Normal range of motion. Neck supple. No JVD present. No tracheal deviation present.  Cardiovascular: Normal rate, regular rhythm and normal heart  sounds.  Exam reveals no gallop and no friction rub.   No murmur heard. Pulmonary/Chest: Effort normal and breath sounds normal. No respiratory distress. He has no wheezes. He has no rales. He exhibits no tenderness.  Abdominal: Soft. Bowel sounds are normal. He exhibits no distension and no mass. There is no  tenderness. There is no rebound and no guarding.  Musculoskeletal: Normal range of motion. He exhibits no edema.  Neurological: He is alert and oriented to person, place, and time.  Skin: Skin is warm. No rash noted. No erythema.  Psychiatric: Affect and judgment normal.      LABORATORY PANEL:   CBC  Recent Labs Lab 07/16/17 0743  WBC 3.6*  HGB 9.8*  HCT 29.4*  PLT 275   ------------------------------------------------------------------------------------------------------------------  Chemistries   Recent Labs Lab 07/16/17 0743  NA 138  K 3.3*  CL 104  CO2 26  GLUCOSE 83  BUN 9  CREATININE 0.85  CALCIUM 8.8*  AST 50*  ALT 31  ALKPHOS 46  BILITOT 0.7   ------------------------------------------------------------------------------------------------------------------  Cardiac Enzymes  Recent Labs Lab 07/16/17 0743  TROPONINI <0.03   ------------------------------------------------------------------------------------------------------------------  RADIOLOGY:  Dg Chest 2 View  Result Date: 07/16/2017 CLINICAL DATA:  On and off right-sided chest pain. EXAM: CHEST  2 VIEW COMPARISON:  05/08/2017 FINDINGS: There are streaky basal opacities bilaterally. Bronchopneumonia or atelectasis can have this appearance. No edema, effusion, or pneumothorax. Normal heart size and mediastinal contours. IMPRESSION: Bilateral bronchopneumonia or atelectasis in the lower lungs. Electronically Signed   By: Marnee SpringJonathon  Watts M.D.   On: 07/16/2017 08:09   Ct Angio Chest Pe W And/or Wo Contrast  Result Date: 07/16/2017 CLINICAL DATA:  Chest pain, elevated D-dimer. EXAM: CT ANGIOGRAPHY CHEST WITH CONTRAST TECHNIQUE: Multidetector CT imaging of the chest was performed using the standard protocol during bolus administration of intravenous contrast. Multiplanar CT image reconstructions and MIPs were obtained to evaluate the vascular anatomy. CONTRAST:  75 cc Isovue 370 IV COMPARISON:  Chest  x-ray earlier today FINDINGS: Cardiovascular: No filling defects in the pulmonary arteries to suggest pulmonary emboli. Heart is normal size. Aorta is normal caliber. Mediastinum/Nodes: No mediastinal, hilar, or axillary adenopathy. Trachea and esophagus are unremarkable. Lungs/Pleura: Nodular airspace disease in the right upper lobe, right middle lobe, lingula and left lower lobe most compatible with multifocal pneumonia. No effusions. Upper Abdomen: Imaging into the upper abdomen shows no acute findings. Musculoskeletal: Chest wall soft tissues are unremarkable. No acute bony abnormality. Review of the MIP images confirms the above findings. IMPRESSION: Multifocal nodular airspace disease most compatible with pneumonia. No evidence of pulmonary embolus. Electronically Signed   By: Charlett NoseKevin  Dover M.D.   On: 07/16/2017 11:16   [place] Abdomen Limited Ruq  Result Date: 07/16/2017 CLINICAL DATA:  Right quadrant pain. EXAM: ULTRASOUND ABDOMEN LIMITED RIGHT UPPER QUADRANT COMPARISON:  06/05/2017. FINDINGS: Gallbladder: No gallstones or wall thickening visualized. No sonographic Murphy sign noted by sonographer. Common bile duct: Diameter: 2.6 mm Liver: No focal lesion identified. Within normal limits in parenchymal echogenicity. IMPRESSION: No acute or focal abnormality identified. Electronically Signed   By: Maisie Fushomas  Register   On: 07/16/2017 09:21    EKG:  Sinus rhythm no ST elevation or depression  IMPRESSION AND PLAN:   26 year old male with no past medical history who presents with right-sided chest pain and found to have bilateral pneumonia.  1. Community-acquired pneumonia/bilateral pneumonia: Continue treatment with Rocephin and azithromycin Follow up on sputum and blood culture  2. Sepsis: Patient presents with tachycardia,  leukopenia and tachypnea Sepsis due to problem #1 Continue IV fluids Follow lactic acid level Follow blood culture Check HIV status  3. Hypokalemia: Replete and recheck in  a.m.  4. Anemia: Check iron panel  All the records are reviewed and case discussed with ED provider. Management plans discussed with the patient and he is in agreement  CODE STATUS: full  TOTAL TIME TAKING CARE OF THIS PATIENT: 45 minutes.    Traeson Dusza M.D on 07/16/2017 at 12:25 PM  Between 7am to 6pm - Pager - [phone]  After 6pm go to [email or web address] - password [email or web address]  [facility]  Office  [phone]  CC: Primary care physician; Patient, No Pcp Per

## 2017-07-16 NOTE — ED Provider Notes (Signed)
[facility] Emergency Department Provider Note  ____________________________________________   First MD Initiated Contact with Patient 07/16/17 [phone]     (approximate)  I have reviewed the triage vital signs and the nursing notes.   HISTORY  Chief Complaint Abdominal Pain   HPI Jeremiah Reed is a 26 y.o. male with a recent diagnosis of bronchitis who has recently finished steroids and antibiotics was presenting to the emergency department with right-sided chest and upper abdominal pain. He says the pain is a 1 out of 10 right now but spikes to an 8 to a 10 out of 10 at times without provocation. He denies any pain with deep breathing. Says that he does of a persisting cough and sometimes coughs up mucus. He denies fever or known sick contacts. He says that he is still taking his rescue inhaler intermittently. Denies any worsening with eating food. Says that he has a history of an appendectomy.Denies any radiation to his back.   Past Medical History:  Diagnosis Date  . Heart murmur     There are no active problems to display for this patient.   Past Surgical History:  Procedure Laterality Date  . APPENDECTOMY      Prior to Admission medications   Medication Sig Start Date End Date Taking? Authorizing Provider  amoxicillin (AMOXIL) 875 MG tablet Take 1 tablet (875 mg total) by mouth 2 (two) times daily. Patient not taking: Reported on 05/08/2017 04/18/17   Cuthriell, Delorise RoyalsJonathan D, PA-C  famotidine (PEPCID) 20 MG tablet Take 1 tablet (20 mg total) by mouth daily. Patient not taking: Reported on 06/05/2017 05/08/17 05/08/18  Rebecka ApleyWebster, Allison P, MD  magic mouthwash w/lidocaine SOLN Take 5 mLs by mouth 4 (four) times daily. Patient not taking: Reported on 05/08/2017 04/18/17   Cuthriell, Delorise RoyalsJonathan D, PA-C  senna-docusate (SENOKOT-S) 8.6-50 MG tablet Take 2 tablets by mouth 2 (two) times daily. 06/05/17   Sharman CheekStafford, Phillip, MD    Allergies Patient has no known  allergies.  No family history on file.  Social History Social History  Substance Use Topics  . Smoking status: Never Smoker  . Smokeless tobacco: Never Used  . Alcohol use Yes    Review of Systems  Constitutional: No fever/chills Eyes: No visual changes. ENT: No sore throat. Cardiovascular: as above Respiratory: as above Gastrointestinal:  No nausea, no vomiting.  No diarrhea.  No constipation. Genitourinary: Negative for dysuria. Musculoskeletal: Negative for back pain. Skin: Negative for rash. Neurological: Negative for headaches, focal weakness or numbness.   ____________________________________________   PHYSICAL EXAM:  VITAL SIGNS: ED Triage Vitals  Enc Vitals Group     BP 07/16/17 0736 129/73     Pulse Rate 07/16/17 0736 (!) 109     Resp 07/16/17 0736 20     Temp 07/16/17 0736 100.1 F (37.8 C)     Temp Source 07/16/17 0736 Oral     SpO2 07/16/17 0736 99 %     Weight 07/16/17 0736 153 lb (69.4 kg)     Height --      Head Circumference --      Peak Flow --      Pain Score 07/16/17 0740 6     Pain Loc --      Pain Edu? --      Excl. in GC? --     Constitutional: Alert and oriented. Well appearing and in no acute distress. Eyes: Conjunctivae are normal.  Head: Atraumatic. Nose: No congestion/rhinnorhea. Mouth/Throat: Mucous membranes are moist.  Neck: No stridor.   Cardiovascular: Tachycardic, regular rhythm. Grossly normal heart sounds.  Tenderness palpation overlying the right lower and anterior chest wall. No lesions on the chest wall. Respiratory: Normal respiratory effort.  No retractions. Lungs CTAB. Gastrointestinal: Soft with minimal right upper quadrant tenderness to palpation with a negative Murphy sign. No distention. No CVA tenderness. Musculoskeletal: No lower extremity tenderness nor edema.  No joint effusions. Neurologic:  Normal speech and language. No gross focal neurologic deficits are appreciated. Skin:  Skin is warm, dry and intact.  No rash noted. Psychiatric: Mood and affect are normal. Speech and behavior are normal.  ____________________________________________   LABS (all labs ordered are listed, but only abnormal results are displayed)  Labs Reviewed  CBC WITH DIFFERENTIAL/PLATELET  COMPREHENSIVE METABOLIC PANEL  LIPASE, BLOOD  URINALYSIS, COMPLETE (UACMP) WITH MICROSCOPIC  TROPONIN I   ____________________________________________  EKG  ED ECG REPORT I, Schaevitz,  Teena Iraniavid M, the attending physician, personally viewed and interpreted this ECG.   Date: 07/16/2017  EKG Time: 752  Rate: 96  Rhythm: normal sinus rhythm  Axis: Normal axis  Intervals:none  ST&T Change: No ST segment elevation or depression. No abnormal T-wave inversion.  ____________________________________________  RADIOLOGY  Chest x-ray which appears to have multilobar pneumonia which was confirmed on CT angiography which does not show PE. ____________________________________________   PROCEDURES  Procedure(s) performed:   Procedures  Critical Care performed:   ____________________________________________   INITIAL IMPRESSION / ASSESSMENT AND PLAN / ED COURSE  Pertinent labs & imaging results that were available during my care of the patient were reviewed by me and considered in my medical decision making (see chart for details).  ----------------------------------------- 11:51 AM on 07/16/2017 -----------------------------------------  Patient's temperature taken and is now 101.9. Also with decreased PVCs. Sepsis were called. Patient will be admitted to the hospital for failure of outpatient antibiotics and now multilobar pneumonia with sepsis. Signed out to Dr. Tildon HuskyModi. Patient is understanding of the diagnosis and treatment plan.      ____________________________________________   FINAL CLINICAL IMPRESSION(S) / ED DIAGNOSES  Multilobar pneumonia. Failure of outpatient antibiotics.    NEW MEDICATIONS STARTED  DURING THIS VISIT:  New Prescriptions   No medications on file     Note:  This document was prepared using Dragon voice recognition software and may include unintentional dictation errors.     Myrna BlazerSchaevitz, David Matthew, MD 07/16/17 [phone]

## 2017-07-16 NOTE — ED Triage Notes (Signed)
Pt with RUQ pain since last night.

## 2017-07-16 NOTE — ED Notes (Signed)
Pt in ultrasound; unable to collect d-dimer at this time.

## 2017-07-17 LAB — HIV ANTIBODY (ROUTINE TESTING W REFLEX): HIV Screen 4th Generation wRfx: REACTIVE — AB

## 2017-07-17 LAB — CBC
HEMATOCRIT: 25.5 % — AB (ref 40.0–52.0)
Hemoglobin: 8.5 g/dL — ABNORMAL LOW (ref 13.0–18.0)
MCH: 26.5 pg (ref 26.0–34.0)
MCHC: 33.3 g/dL (ref 32.0–36.0)
MCV: 79.4 fL — ABNORMAL LOW (ref 80.0–100.0)
PLATELETS: 255 10*3/uL (ref 150–440)
RBC: 3.21 MIL/uL — AB (ref 4.40–5.90)
RDW: 16.9 % — AB (ref 11.5–14.5)
WBC: 4.3 10*3/uL (ref 3.8–10.6)

## 2017-07-17 LAB — BASIC METABOLIC PANEL
Anion gap: 4 — ABNORMAL LOW (ref 5–15)
BUN: 7 mg/dL (ref 6–20)
CO2: 24 mmol/L (ref 22–32)
CREATININE: 0.68 mg/dL (ref 0.61–1.24)
Calcium: 7.9 mg/dL — ABNORMAL LOW (ref 8.9–10.3)
Chloride: 110 mmol/L (ref 101–111)
Glucose, Bld: 103 mg/dL — ABNORMAL HIGH (ref 65–99)
POTASSIUM: 3.8 mmol/L (ref 3.5–5.1)
SODIUM: 138 mmol/L (ref 135–145)

## 2017-07-17 LAB — RAPID HIV SCREEN (HIV 1/2 AB+AG)
HIV 1/2 Antibodies: REACTIVE — AB
HIV-1 P24 Antigen - HIV24: NONREACTIVE

## 2017-07-17 LAB — HIV 1/2 AB DIFFERENTIATION
HIV 1 Ab: POSITIVE — AB
HIV 2 AB: NEGATIVE

## 2017-07-17 LAB — EXPECTORATED SPUTUM ASSESSMENT W GRAM STAIN, RFLX TO RESP C

## 2017-07-17 LAB — EXPECTORATED SPUTUM ASSESSMENT W REFEX TO RESP CULTURE

## 2017-07-17 LAB — PROCALCITONIN

## 2017-07-17 MED ORDER — GUAIFENESIN-DM 100-10 MG/5ML PO SYRP
5.0000 mL | ORAL_SOLUTION | ORAL | Status: DC | PRN
  Administered 2017-07-17: 04:00:00 5 mL via ORAL
  Filled 2017-07-17 (×2): qty 5

## 2017-07-17 MED ORDER — FLUCONAZOLE IN SODIUM CHLORIDE 200-0.9 MG/100ML-% IV SOLN
200.0000 mg | INTRAVENOUS | Status: DC
  Administered 2017-07-17: 17:00:00 200 mg via INTRAVENOUS
  Filled 2017-07-17 (×2): qty 100

## 2017-07-17 MED ORDER — HYDROCOD POLST-CPM POLST ER 10-8 MG/5ML PO SUER
5.0000 mL | Freq: Two times a day (BID) | ORAL | Status: DC
  Administered 2017-07-17 – 2017-07-18 (×3): 5 mL via ORAL
  Filled 2017-07-17 (×3): qty 5

## 2017-07-17 MED ORDER — FERROUS SULFATE 325 (65 FE) MG PO TABS
325.0000 mg | ORAL_TABLET | Freq: Two times a day (BID) | ORAL | Status: DC
  Administered 2017-07-17 – 2017-07-20 (×6): 325 mg via ORAL
  Filled 2017-07-17 (×6): qty 1

## 2017-07-17 MED ORDER — SODIUM CHLORIDE 0.9 % IV SOLN
200.0000 mg | INTRAVENOUS | Status: AC
  Administered 2017-07-17 – 2017-07-19 (×3): 200 mg via INTRAVENOUS
  Filled 2017-07-17 (×3): qty 10

## 2017-07-17 MED ORDER — AZITHROMYCIN 500 MG PO TABS
500.0000 mg | ORAL_TABLET | Freq: Every day | ORAL | Status: DC
  Administered 2017-07-18 – 2017-07-19 (×2): 500 mg via ORAL
  Filled 2017-07-17 (×2): qty 1

## 2017-07-17 MED ORDER — FAMOTIDINE 20 MG PO TABS
20.0000 mg | ORAL_TABLET | Freq: Every day | ORAL | Status: DC
  Administered 2017-07-17 – 2017-07-20 (×4): 20 mg via ORAL
  Filled 2017-07-17 (×4): qty 1

## 2017-07-17 MED ORDER — DEXTROSE 5 % IV SOLN
20.0000 mg/kg/d | Freq: Four times a day (QID) | INTRAVENOUS | Status: DC
  Administered 2017-07-17 – 2017-07-19 (×8): 347.2 mg via INTRAVENOUS
  Filled 2017-07-17 (×16): qty 21.7

## 2017-07-17 NOTE — Progress Notes (Signed)
Notified Dr Nemiah CommanderKalisetti that patient had positive HIV antibodies. MD acknowledged, no new orders.

## 2017-07-17 NOTE — Progress Notes (Signed)
Pharmacy Antibiotic Note  Jeremiah Reed is a 26 y.o. male admitted on 07/16/2017 with PCP PNA and oropharyngeal candidiasis .  Pharmacy has been consulted for Bactrim and fluconazole dosing.  Plan: 1. Bactrim 20 mg/kg/day IV (=347.2 mg Q6H) 2. Fluconazole 200 mg IV Q24H  Height: 5\' 3"  (160 cm) Weight: 153 lb (69.4 kg) IBW/kg (Calculated) : 56.9  Temp (24hrs), Avg:100.2 F (37.9 C), Min:98.8 F (37.1 C), Max:102.9 F (39.4 C)   Recent Labs Lab 07/16/17 0743 07/16/17 1241 07/16/17 1448 07/17/17 0427  WBC 3.6*  --   --  4.3  CREATININE 0.85  --   --  0.68  LATICACIDVEN  --  0.7 1.0  --     Estimated Creatinine Clearance: 122.5 mL/min (by C-G formula based on SCr of 0.68 mg/dL).    No Known Allergies  Thank you for allowing pharmacy to be a part of this patient's care.  Carola FrostNathan A Violette Morneault, Pharm.D., BCPS Clinical Pharmacist 07/17/2017 4:14 PM

## 2017-07-17 NOTE — Care Management (Signed)
Admitted to [facility] with the diagnosis of sepsis/pneumonia. Lives with mother Jeremiah Reed [phone]). No primary care physician. No insurance. [place] [employer] [place]. [place] and has never been to [employer] [facility]. Jeremiah Reed's telephone number is [phone]. Applications to [facility] and Medication Management given Jeremiah Reed update Jeremiah Reed, [facility] representative Jeremiah Reed S Jeremiah Bastedo RN MSN CCM Care Management [phone]

## 2017-07-17 NOTE — Consult Note (Signed)
[facility] Infectious Disease     Reason for Consult: PNA, HIV    Referring Physician: Claria Dice Date of Admission:  07/16/2017   Active Problems:   Sepsis ([place])   HPI: Elise Gladden is a 26 y.o. male admitted with 3-4 weeks cough and SOB, fever, chills as well as R sided CP.  He was treated with azithro several weeks ago. On admit wbc 3, temps up to 103 and CT with bil infiltrates. He has tested + for HIV. He reports no prior HIV test and no hx STDs. He denies hx TB or TB contacts. He has sex with both males and females but does not know if any of his sex partners have had HIV. He has been having night sweats and has a 20 # wt loss. [place] - he is a Tourist information centre manager.  Denies skin lesions, HA, neck pain, dysuria, genital or rectal lesions.   Past Medical History:  Diagnosis Date  . Heart murmur    Past Surgical History:  Procedure Laterality Date  . APPENDECTOMY     Social History  Substance Use Topics  . Smoking status: Never Smoker  . Smokeless tobacco: Never Used  . Alcohol use Yes   History reviewed. No pertinent family history.  Allergies: No Known Allergies  Current antibiotics: Antibiotics Given (last 72 hours)    Date/Time Action Medication Dose Rate   07/16/17 1248 New Bag/Given   cefTRIAXone (ROCEPHIN) 1 g in dextrose 5 % 50 mL IVPB 1 g 100 mL/hr   07/16/17 1252 New Bag/Given   azithromycin (ZITHROMAX) 500 mg in dextrose 5 % 250 mL IVPB 500 mg 250 mL/hr   07/17/17 1031 New Bag/Given   cefTRIAXone (ROCEPHIN) 1 g in dextrose 5 % 50 mL IVPB 1 g 120 mL/hr   07/17/17 1034 New Bag/Given   azithromycin (ZITHROMAX) 500 mg in dextrose 5 % 250 mL IVPB 500 mg 250 mL/hr      MEDICATIONS: . [START ON 07/18/2017] azithromycin  500 mg Oral Daily  . chlorpheniramine-HYDROcodone  5 mL Oral Q12H  . enoxaparin (LOVENOX) injection  40 mg Subcutaneous Q24H  . famotidine  20 mg Oral Daily  . ferrous  sulfate  325 mg Oral BID WC    Review of Systems - 11 systems reviewed and negative per HPI   OBJECTIVE: Temp:  [98.8 F (37.1 C)-102.9 F (39.4 C)] 99 F (37.2 C) (08/01 0823) Pulse Rate:  [96-115] 115 (08/01 0338) Resp:  [14-17] 17 (08/01 0338) BP: (110-128)/(63-73) 128/73 (08/01 0338) SpO2:  [92 %-100 %] 92 % (08/01 0338) Physical Exam  Constitutional: He is oriented to person, place, and time. He appears thin, No distress.  HENT: anicteric, pale Mouth/Throat: Oropharynx - + thrush on hard palate Cardiovascular: Normal rate, regular rhythm and normal heart sounds..  Pulmonary/Chest: bil rhonchi Abdominal: Soft. Bowel sounds are normal. He exhibits no distension. There is no tenderness.  Lymphadenopathy: He has no cervical adenopathy.  Neurological: He is alert and oriented to person, place, and time.  Skin: Skin is warm and dry. No rash noted. No erythema.  Psychiatric: He has a normal mood and affect. His behavior is normal.   LABS: Results for orders placed or performed during the hospital encounter of 07/16/17 (from the past 48 hour(s))  CBC with Differential     Status: Abnormal   Collection Time: 07/16/17  7:43 AM  Result Value Ref Range  WBC 3.6 (L) 3.8 - 10.6 K/uL   RBC 3.64 (L) 4.40 - 5.90 MIL/uL   Hemoglobin 9.8 (L) 13.0 - 18.0 g/dL   HCT 29.4 (L) 40.0 - 52.0 %   MCV 80.8 80.0 - 100.0 fL   MCH 27.0 26.0 - 34.0 pg   MCHC 33.4 32.0 - 36.0 g/dL   RDW 16.9 (H) 11.5 - 14.5 %   Platelets 275 150 - 440 K/uL   Neutrophils Relative % 68 %   Neutro Abs 2.4 1.4 - 6.5 K/uL   Lymphocytes Relative 21 %   Lymphs Abs 0.7 (L) 1.0 - 3.6 K/uL   Monocytes Relative 11 %   Monocytes Absolute 0.4 0.2 - 1.0 K/uL   Eosinophils Relative 0 %   Eosinophils Absolute 0.0 0 - 0.7 K/uL   Basophils Relative 0 %   Basophils Absolute 0.0 0 - 0.1 K/uL  Comprehensive metabolic panel     Status: Abnormal   Collection Time: 07/16/17  7:43 AM  Result Value Ref Range   Sodium 138 135 - 145  mmol/L   Potassium 3.3 (L) 3.5 - 5.1 mmol/L   Chloride 104 101 - 111 mmol/L   CO2 26 22 - 32 mmol/L   Glucose, Bld 83 65 - 99 mg/dL   BUN 9 6 - 20 mg/dL   Creatinine, Ser 0.85 0.61 - 1.24 mg/dL   Calcium 8.8 (L) 8.9 - 10.3 mg/dL   Total Protein 7.8 6.5 - 8.1 g/dL   Albumin 3.6 3.5 - 5.0 g/dL   AST 50 (H) 15 - 41 U/L   ALT 31 17 - 63 U/L   Alkaline Phosphatase 46 38 - 126 U/L   Total Bilirubin 0.7 0.3 - 1.2 mg/dL   GFR calc non Af Amer >60 >60 mL/min   GFR calc Af Amer >60 >60 mL/min    Comment: (NOTE) The eGFR has been calculated using the CKD EPI equation. This calculation has not been validated in all clinical situations. eGFR's persistently <60 mL/min signify possible Chronic Kidney Disease.    Anion gap 8 5 - 15  Lipase, blood     Status: None   Collection Time: 07/16/17  7:43 AM  Result Value Ref Range   Lipase 25 11 - 51 U/L  Troponin I     Status: None   Collection Time: 07/16/17  7:43 AM  Result Value Ref Range   Troponin I <0.03 <0.03 ng/mL  Urinalysis, Complete w Microscopic     Status: Abnormal   Collection Time: 07/16/17  8:10 AM  Result Value Ref Range   Color, Urine YELLOW (A) YELLOW   APPearance CLEAR (A) CLEAR   Specific Gravity, Urine 1.012 1.005 - 1.030   pH 6.0 5.0 - 8.0   Glucose, UA NEGATIVE NEGATIVE mg/dL   Hgb urine dipstick NEGATIVE NEGATIVE   Bilirubin Urine NEGATIVE NEGATIVE   Ketones, ur 5 (A) NEGATIVE mg/dL   Protein, ur 30 (A) NEGATIVE mg/dL   Nitrite NEGATIVE NEGATIVE   Leukocytes, UA NEGATIVE NEGATIVE   RBC / HPF 0-5 0 - 5 RBC/hpf   WBC, UA 0-5 0 - 5 WBC/hpf   Bacteria, UA NONE SEEN NONE SEEN   Squamous Epithelial / LPF NONE SEEN NONE SEEN   Mucous PRESENT   Fibrin derivatives D-Dimer ([facility] only)     Status: Abnormal   Collection Time: 07/16/17  9:16 AM  Result Value Ref Range   Fibrin derivatives D-dimer ([facility]) 1,151.05 (H) 0.00 - 499.00    Comment: (NOTE) <>  Exclusion of Venous Thromboembolism (VTE) - OUTPATIENT ONLY    (Emergency Department or [place])   0-499 ng/ml (FEU): With a low to intermediate pretest probability                      for VTE this test result excludes the diagnosis                      of VTE.   >499 ng/ml (FEU) : VTE not excluded; additional work up for VTE is                      required. <> Testing on Inpatients and Evaluation of Disseminated Intravascular   Coagulation ([facility]IC) Reference Range:   0-499 ng/ml (FEU)   Blood Culture (routine x 2)     Status: None (Preliminary result)   Collection Time: 07/16/17 12:41 PM  Result Value Ref Range   Specimen Description BLOOD RIGHT AC    Special Requests      BOTTLES DRAWN AEROBIC AND ANAEROBIC Blood Culture results may not be optimal due to an excessive volume of blood received in culture bottles   Culture NO GROWTH < 24 HOURS    Report Status PENDING   Blood Culture (routine x 2)     Status: None (Preliminary result)   Collection Time: 07/16/17 12:41 PM  Result Value Ref Range   Specimen Description BLOOD LEFT AC    Special Requests      BOTTLES DRAWN AEROBIC AND ANAEROBIC Blood Culture adequate volume   Culture NO GROWTH < 24 HOURS    Report Status PENDING   Lactic acid, plasma     Status: None   Collection Time: 07/16/17 12:41 PM  Result Value Ref Range   Lactic Acid, Venous 0.7 0.5 - 1.9 mmol/L  Lactic acid, plasma     Status: None   Collection Time: 07/16/17  2:48 PM  Result Value Ref Range   Lactic Acid, Venous 1.0 0.5 - 1.9 mmol/L  Vitamin B12     Status: None   Collection Time: 07/16/17  2:48 PM  Result Value Ref Range   Vitamin B-12 688 180 - 914 pg/mL    Comment: (NOTE) This assay is not validated for testing neonatal or myeloproliferative syndrome specimens for Vitamin B12 levels. Performed at [facility], [place] [address] [place], [place] [address]   Folate     Status: None   Collection Time: 07/16/17  2:48 PM  Result Value Ref Range   Folate 22.0 >5.9 ng/mL  Iron and TIBC     Status: Abnormal    Collection Time: 07/16/17  2:48 PM  Result Value Ref Range   Iron 13 (L) 45 - 182 ug/dL   TIBC 185 (L) 250 - 450 ug/dL   Saturation Ratios 7 (L) 17.9 - 39.5 %   UIBC 172 ug/dL  Ferritin     Status: Abnormal   Collection Time: 07/16/17  2:48 PM  Result Value Ref Range   Ferritin 2,305 (H) 24 - 336 ng/mL  Reticulocytes     Status: Abnormal   Collection Time: 07/16/17  2:48 PM  Result Value Ref Range   Retic Ct Pct 0.7 0.4 - 3.1 %   RBC. 3.43 (L) 4.40 - 5.90 MIL/uL   Retic Count, Absolute 24.0 19.0 - 183.0 K/uL  Culture, sputum-assessment     Status: None   Collection Time: 07/17/17  3:32 AM  Result Value  Ref Range   Specimen Description SPU    Special Requests NONE    Sputum evaluation THIS SPECIMEN IS ACCEPTABLE FOR SPUTUM CULTURE    Report Status 07/17/2017 FINAL   Culture, respiratory (NON-Expectorated)     Status: None (Preliminary result)   Collection Time: 07/17/17  3:32 AM  Result Value Ref Range   Specimen Description SPU    Special Requests NONE Reflexed from [id number]    Gram Stain      RARE WBC PRESENT, PREDOMINANTLY PMN MODERATE GRAM POSITIVE COCCI IN PAIRS IN CLUSTERS FEW GRAM POSITIVE RODS FEW GRAM NEGATIVE RODS RARE YEAST Performed at [facility], [place] [address] [place], [place] [address]    Culture PENDING    Report Status PENDING   Basic metabolic panel     Status: Abnormal   Collection Time: 07/17/17  4:27 AM  Result Value Ref Range   Sodium 138 135 - 145 mmol/L   Potassium 3.8 3.5 - 5.1 mmol/L   Chloride 110 101 - 111 mmol/L   CO2 24 22 - 32 mmol/L   Glucose, Bld 103 (H) 65 - 99 mg/dL   BUN 7 6 - 20 mg/dL   Creatinine, Ser 0.68 0.61 - 1.24 mg/dL   Calcium 7.9 (L) 8.9 - 10.3 mg/dL   GFR calc non Af Amer >60 >60 mL/min   GFR calc Af Amer >60 >60 mL/min    Comment: (NOTE) The eGFR has been calculated using the CKD EPI equation. This calculation has not been validated in all clinical situations. eGFR's persistently <60 mL/min signify possible  Chronic Kidney Disease.    Anion gap 4 (L) 5 - 15  CBC     Status: Abnormal   Collection Time: 07/17/17  4:27 AM  Result Value Ref Range   WBC 4.3 3.8 - 10.6 K/uL   RBC 3.21 (L) 4.40 - 5.90 MIL/uL   Hemoglobin 8.5 (L) 13.0 - 18.0 g/dL   HCT 25.5 (L) 40.0 - 52.0 %   MCV 79.4 (L) 80.0 - 100.0 fL   MCH 26.5 26.0 - 34.0 pg   MCHC 33.3 32.0 - 36.0 g/dL   RDW 16.9 (H) 11.5 - 14.5 %   Platelets 255 150 - 440 K/uL  Procalcitonin - Baseline     Status: None   Collection Time: 07/17/17  4:27 AM  Result Value Ref Range   Procalcitonin <0.10 ng/mL    Comment:        Interpretation: PCT (Procalcitonin) <= 0.5 ng/mL: Systemic infection (sepsis) is not likely. Local bacterial infection is possible. (NOTE)         ICU PCT Algorithm               Non ICU PCT Algorithm    ----------------------------     ------------------------------         PCT < 0.25 ng/mL                 PCT < 0.1 ng/mL     Stopping of antibiotics            Stopping of antibiotics       strongly encouraged.               strongly encouraged.    ----------------------------     ------------------------------       PCT level decrease by               PCT < 0.25 ng/mL       >= 80%  from peak PCT       OR PCT 0.25 - 0.5 ng/mL          Stopping of antibiotics                                             encouraged.     Stopping of antibiotics           encouraged.    ----------------------------     ------------------------------       PCT level decrease by              PCT >= 0.25 ng/mL       < 80% from peak PCT        AND PCT >= 0.5 ng/mL            Continuin g antibiotics                                              encouraged.       Continuing antibiotics            encouraged.    ----------------------------     ------------------------------     PCT level increase compared          PCT > 0.5 ng/mL         with peak PCT AND          PCT >= 0.5 ng/mL             Escalation of antibiotics                                           strongly encouraged.      Escalation of antibiotics        strongly encouraged.   Rapid HIV screen (HIV 1/2 Ab+Ag)     Status: Abnormal   Collection Time: 07/17/17  4:27 AM  Result Value Ref Range   HIV-1 P24 Antigen - HIV24 NON REACTIVE NON REACTIVE   HIV 1/2 Antibodies Reactive (A) NON REACTIVE    Comment: RESULT CALLED TO, READ BACK BY AND VERIFIED WITH: DEIDRA MALCOLM 07/17/17 @ 1502  MLK    Interpretation (HIV Ag Ab)      A reactive test result means that HIV 1 or HIV 2 antibodies have been detected in the specimen. The test result is interpreted as Preliminary Positive for HIV 1 and/or HIV 2 antibodies.    Comment: SENT FOR CONFIRMATION   No components found for: ESR, C REACTIVE PROTEIN MICRO: Recent Results (from the past 720 hour(s))  Blood Culture (routine x 2)     Status: None (Preliminary result)   Collection Time: 07/16/17 12:41 PM  Result Value Ref Range Status   Specimen Description BLOOD RIGHT AC  Final   Special Requests   Final    BOTTLES DRAWN AEROBIC AND ANAEROBIC Blood Culture results may not be optimal due to an excessive volume of blood received in culture bottles   Culture NO GROWTH < 24 HOURS  Final   Report Status PENDING  Incomplete  Blood Culture (routine x 2)     Status: None (Preliminary result)   Collection  Time: 07/16/17 12:41 PM  Result Value Ref Range Status   Specimen Description BLOOD LEFT AC  Final   Special Requests   Final    BOTTLES DRAWN AEROBIC AND ANAEROBIC Blood Culture adequate volume   Culture NO GROWTH < 24 HOURS  Final   Report Status PENDING  Incomplete  Culture, sputum-assessment     Status: None   Collection Time: 07/17/17  3:32 AM  Result Value Ref Range Status   Specimen Description SPU  Final   Special Requests NONE  Final   Sputum evaluation THIS SPECIMEN IS ACCEPTABLE FOR SPUTUM CULTURE  Final   Report Status 07/17/2017 FINAL  Final  Culture, respiratory (NON-Expectorated)     Status: None (Preliminary result)    Collection Time: 07/17/17  3:32 AM  Result Value Ref Range Status   Specimen Description SPU  Final   Special Requests NONE Reflexed from [id number]  Final   Gram Stain   Final    RARE WBC PRESENT, PREDOMINANTLY PMN MODERATE GRAM POSITIVE COCCI IN PAIRS IN CLUSTERS FEW GRAM POSITIVE RODS FEW GRAM NEGATIVE RODS RARE YEAST Performed at [facility], [place] [address] [place], [place] [address]    Culture PENDING  Incomplete   Report Status PENDING  Incomplete    IMAGING: Dg Chest 2 View  Result Date: 07/16/2017 CLINICAL DATA:  On and off right-sided chest pain. EXAM: CHEST  2 VIEW COMPARISON:  05/08/2017 FINDINGS: There are streaky basal opacities bilaterally. Bronchopneumonia or atelectasis can have this appearance. No edema, effusion, or pneumothorax. Normal heart size and mediastinal contours. IMPRESSION: Bilateral bronchopneumonia or atelectasis in the lower lungs. Electronically Signed   By: Monte Fantasia M.D.   On: 07/16/2017 08:09   Ct Angio Chest Pe W And/or Wo Contrast  Result Date: 07/16/2017 CLINICAL DATA:  Chest pain, elevated D-dimer. EXAM: CT ANGIOGRAPHY CHEST WITH CONTRAST TECHNIQUE: Multidetector CT imaging of the chest was performed using the standard protocol during bolus administration of intravenous contrast. Multiplanar CT image reconstructions and MIPs were obtained to evaluate the vascular anatomy. CONTRAST:  75 cc Isovue 370 IV COMPARISON:  Chest x-ray earlier today FINDINGS: Cardiovascular: No filling defects in the pulmonary arteries to suggest pulmonary emboli. Heart is normal size. Aorta is normal caliber. Mediastinum/Nodes: No mediastinal, hilar, or axillary adenopathy. Trachea and esophagus are unremarkable. Lungs/Pleura: Nodular airspace disease in the right upper lobe, right middle lobe, lingula and left lower lobe most compatible with multifocal pneumonia. No effusions. Upper Abdomen: Imaging into the upper abdomen shows no acute findings. Musculoskeletal:  Chest wall soft tissues are unremarkable. No acute bony abnormality. Review of the MIP images confirms the above findings. IMPRESSION: Multifocal nodular airspace disease most compatible with pneumonia. No evidence of pulmonary embolus. Electronically Signed   By: Rolm Baptise M.D.   On: 07/16/2017 11:16   US Abdomen Limited Ruq  Result Date: 07/16/2017 CLINICAL DATA:  Right quadrant pain. EXAM: ULTRASOUND ABDOMEN LIMITED RIGHT UPPER QUADRANT COMPARISON:  06/05/2017. FINDINGS: Gallbladder: No gallstones or wall thickening visualized. No sonographic Murphy sign noted by sonographer. Common bile duct: Diameter: 2.6 mm Liver: No focal lesion identified. Within normal limits in parenchymal echogenicity. IMPRESSION: No acute or focal abnormality identified. Electronically Signed   By: Marcello Moores  Register   On: 07/16/2017 09:21    Assessment:   Basem Yannuzzi is a 26 y.o. male with bil pna and newly dxed HIV. Has thrush on exam so CD4 likely < 200.  He has had a 20# wt loss, night sweats  and now cough. Has diarrhea as well after episodes of constipation. No evidence meningitis. I suspect pulm process is either routine bacterial Pna (strep PNA), PCP or TB so need to rule out with 3 AFB.  He tells me he has family support and is taking the diagnosis well. He will discuss with his mother and I offered to answer questions if needed. I told him we can enroll him in the [facility] and will ask our SW Robie Ridge to contact him.   Will try to start ART ASAP once clinically improving from the PNA  Recommendations Check CD4, Viral load and genotype Move to isolation and check 3 AFB. Check routine sputum - pending Induce sputum for PCP. Check crypto ag, RPR, Hep serologies, GC urine.  Start fluconazole for thrush For abx coverage cont ceftriaxone and azithro Add Bactrim for PCP If declines would need bronch.  Thank you very much for allowing me to participate in the care of this patient. Please  call with questions.   Cheral Marker. Ola Spurr, MD

## 2017-07-17 NOTE — Progress Notes (Addendum)
[facility] [place] [facility]   PATIENT NAME: Jeremiah SaltsKyree Reed    MR#:  [id number]  DATE OF BIRTH:  03/28/1991  SUBJECTIVE:  CHIEF COMPLAINT:   Chief Complaint  Patient presents with  . Abdominal Pain   - doing well, but has occasional right sided chest pain- sharp - and also has episodes of coughing spells  REVIEW OF SYSTEMS:  Review of Systems  Constitutional: Positive for fever and malaise/fatigue. Negative for chills.  HENT: Negative for ear discharge, ear pain, hearing loss and nosebleeds.   Eyes: Negative for blurred vision and double vision.  Respiratory: Positive for cough. Negative for shortness of breath and wheezing.   Cardiovascular: Negative for chest pain, palpitations and leg swelling.  Gastrointestinal: Negative for abdominal pain, constipation, diarrhea, nausea and vomiting.  Genitourinary: Negative for dysuria.  Musculoskeletal: Positive for myalgias.  Neurological: Negative for dizziness, speech change, focal weakness, seizures and headaches.  Psychiatric/Behavioral: Negative for depression.    DRUG ALLERGIES:  No Known Allergies  VITALS:  Blood pressure 128/73, pulse (!) 115, temperature 99 F (37.2 C), temperature source Oral, resp. rate 17, height 5\' 3"  (1.6 m), weight 69.4 kg (153 lb), SpO2 92 %.  PHYSICAL EXAMINATION:  Physical Exam  GENERAL:  26 y.o.-year-old ill nourished patient sitting in the bed with no acute distress.  EYES: Pupils equal, round, reactive to light and accommodation. No scleral icterus. Extraocular muscles intact.  HEENT: Head atraumatic, normocephalic. Oropharynx and nasopharynx clear.  NECK:  Supple, no jugular venous distention. No thyroid enlargement, no tenderness.  LUNGS: Normal breath sounds bilaterally, no wheezing, rales,rhonchi or crepitation. No use of accessory muscles of respiration. Fine bibasilar rhonchi CARDIOVASCULAR: S1, S2 normal. No murmurs, rubs, or gallops.  ABDOMEN: Soft, nontender,  nondistended. Bowel sounds present. No organomegaly or mass.  EXTREMITIES: No pedal edema, cyanosis, or clubbing.  NEUROLOGIC: Cranial nerves II through XII are intact. Muscle strength 5/5 in all extremities. Sensation intact. Gait not checked.  PSYCHIATRIC: The patient is alert and oriented x 3.  SKIN: No obvious rash, lesion, or ulcer.    LABORATORY PANEL:   CBC  Recent Labs Lab 07/17/17 0427  WBC 4.3  HGB 8.5*  HCT 25.5*  PLT 255   ------------------------------------------------------------------------------------------------------------------  Chemistries   Recent Labs Lab 07/16/17 0743 07/17/17 0427  NA 138 138  K 3.3* 3.8  CL 104 110  CO2 26 24  GLUCOSE 83 103*  BUN 9 7  CREATININE 0.85 0.68  CALCIUM 8.8* 7.9*  AST 50*  --   ALT 31  --   ALKPHOS 46  --   BILITOT 0.7  --    ------------------------------------------------------------------------------------------------------------------  Cardiac Enzymes  Recent Labs Lab 07/16/17 0743  TROPONINI <0.03   ------------------------------------------------------------------------------------------------------------------  RADIOLOGY:  Dg Chest 2 View  Result Date: 07/16/2017 CLINICAL DATA:  On and off right-sided chest pain. EXAM: CHEST  2 VIEW COMPARISON:  05/08/2017 FINDINGS: There are streaky basal opacities bilaterally. Bronchopneumonia or atelectasis can have this appearance. No edema, effusion, or pneumothorax. Normal heart size and mediastinal contours. IMPRESSION: Bilateral bronchopneumonia or atelectasis in the lower lungs. Electronically Signed   By: Marnee SpringJonathon  Watts M.D.   On: 07/16/2017 08:09   Ct Angio Chest Pe W And/or Wo Contrast  Result Date: 07/16/2017 CLINICAL DATA:  Chest pain, elevated D-dimer. EXAM: CT ANGIOGRAPHY CHEST WITH CONTRAST TECHNIQUE: Multidetector CT imaging of the chest was performed using the standard protocol during bolus administration of intravenous contrast. Multiplanar CT  image reconstructions and MIPs  were obtained to evaluate the vascular anatomy. CONTRAST:  75 cc Isovue 370 IV COMPARISON:  Chest x-ray earlier today FINDINGS: Cardiovascular: No filling defects in the pulmonary arteries to suggest pulmonary emboli. Heart is normal size. Aorta is normal caliber. Mediastinum/Nodes: No mediastinal, hilar, or axillary adenopathy. Trachea and esophagus are unremarkable. Lungs/Pleura: Nodular airspace disease in the right upper lobe, right middle lobe, lingula and left lower lobe most compatible with multifocal pneumonia. No effusions. Upper Abdomen: Imaging into the upper abdomen shows no acute findings. Musculoskeletal: Chest wall soft tissues are unremarkable. No acute bony abnormality. Review of the MIP images confirms the above findings. IMPRESSION: Multifocal nodular airspace disease most compatible with pneumonia. No evidence of pulmonary embolus. Electronically Signed   By: Charlett NoseKevin  Dover M.D.   On: 07/16/2017 11:16   [place] Abdomen Limited Ruq  Result Date: 07/16/2017 CLINICAL DATA:  Right quadrant pain. EXAM: ULTRASOUND ABDOMEN LIMITED RIGHT UPPER QUADRANT COMPARISON:  06/05/2017. FINDINGS: Gallbladder: No gallstones or wall thickening visualized. No sonographic Murphy sign noted by sonographer. Common bile duct: Diameter: 2.6 mm Liver: No focal lesion identified. Within normal limits in parenchymal echogenicity. IMPRESSION: No acute or focal abnormality identified. Electronically Signed   By: Maisie Fushomas  Register   On: 07/16/2017 09:21    EKG:   Orders placed or performed during the hospital encounter of 07/16/17  . ED EKG  . ED EKG  . EKG 12-Lead  . EKG 12-Lead  . ED EKG 12-Lead  . ED EKG 12-Lead    ASSESSMENT AND PLAN:   26 year old male with no past medical history presents to hospital secondary to chest pain and cough and noted to have bilateral pneumonia.  #1 multilobar pneumonia-treated for bronchitis as outpatient 3 weeks ago, now presenting with pain and  cough -CT chest with no pulmonary embolism but has multilobar pneumonia worse on his left base. -Follow blood cultures. Currently on Rocephin and azithromycin. -Not requiring supplemental oxygen. Agree with checking HIV status. -Not cough medications. - Still spiking fevers. If no improvement by tomorrow, we'll consult infectious disease  #2 Anemia- likely chronic, iron deficiency noted. No active bleeding, venofer x 3 doses and oral iron, check stool occult and stool for parasites  #3 DVT prophylaxis- lovenox    All the records are reviewed and case discussed with Care Management/Social Workerr. Management plans discussed with the patient, family and they are in agreement.  CODE STATUS: Full Code  TOTAL TIME TAKING CARE OF THIS PATIENT: 37 minutes.   POSSIBLE D/C IN  1-2  DAYS, DEPENDING ON CLINICAL CONDITION.   Crosley Stejskal M.D on 07/17/2017 at 1:13 PM  Between 7am to 6pm - Pager - [phone]  After 6pm go to [email or web address] - password [email or web address]  [facility]  Office  [phone]  CC: Primary care physician; Patient, No Pcp Per

## 2017-07-17 NOTE — Progress Notes (Signed)
Pharmacy Antibiotic Note  Jeremiah Reed is a 26 y.o. male admitted on 07/16/2017 with pneumonia/sepsis.  Pharmacy has been consulted for ceftriaxone and azithromycin dosing.  Plan: 1. Ceftriaxone: continue ceftriaxone 1g IV Q24hr.   2. Azithromycin: continue azithromycin 500mg  IV Q24hr. Patient with temp of 102.9 overnight; will continue to monitor for IV to PO conversion.   Will continue to trend procalcitonin levels.   Height: 5\' 3"  (160 cm) Weight: 153 lb (69.4 kg) IBW/kg (Calculated) : 56.9  Temp (24hrs), Avg:100.2 F (37.9 C), Min:98.8 F (37.1 C), Max:102.9 F (39.4 C)   Recent Labs Lab 07/16/17 0743 07/16/17 1241 07/16/17 1448 07/17/17 0427  WBC 3.6*  --   --  4.3  CREATININE 0.85  --   --  0.68  LATICACIDVEN  --  0.7 1.0  --     Estimated Creatinine Clearance: 122.5 mL/min (by C-G formula based on SCr of 0.68 mg/dL).    No Known Allergies  Antimicrobials this admission: Azithromycin 7/31 >>  Ceftriaxone 7/31 >>   Dose adjustments this admission: N/A  Microbiology results: 8/1 BCx: no growth < 24 hours  8/1 Sputum: pending   Thank you for allowing pharmacy to be a part of this patient's care.  Cece Milhouse L 07/17/2017 10:22 AM

## 2017-07-18 LAB — HELPER T-LYMPH-CD4 (ARMC ONLY)
% CD 4 POS. LYMPH.: 7.2 % — AB (ref 30.8–58.5)
ABSOLUTE CD 4 HELPER: 43 /uL — AB (ref 359–1519)
BASOS: 0 %
Basophils Absolute: 0 10*3/uL (ref 0.0–0.2)
EOS (ABSOLUTE): 0 10*3/uL (ref 0.0–0.4)
Eos: 1 %
HEMATOCRIT: 24.2 % — AB (ref 37.5–51.0)
Hemoglobin: 7.8 g/dL — ABNORMAL LOW (ref 13.0–17.7)
IMMATURE GRANS (ABS): 0 10*3/uL (ref 0.0–0.1)
Immature Granulocytes: 0 %
LYMPHS: 16 %
Lymphocytes Absolute: 0.6 10*3/uL — ABNORMAL LOW (ref 0.7–3.1)
MCH: 25.2 pg — AB (ref 26.6–33.0)
MCHC: 32.2 g/dL (ref 31.5–35.7)
MCV: 78 fL — AB (ref 79–97)
MONOS ABS: 0.3 10*3/uL (ref 0.1–0.9)
Monocytes: 9 %
NEUTROS ABS: 2.7 10*3/uL (ref 1.4–7.0)
Neutrophils: 74 %
Platelets: 284 10*3/uL (ref 150–379)
RBC: 3.09 x10E6/uL — AB (ref 4.14–5.80)
RDW: 16.4 % — AB (ref 12.3–15.4)
WBC: 3.7 10*3/uL (ref 3.4–10.8)

## 2017-07-18 LAB — HIV-1/2 AB - DIFFERENTIATION
HIV 1 Ab: POSITIVE — AB
HIV 2 AB: NEGATIVE

## 2017-07-18 LAB — CBC
HCT: 26.2 % — ABNORMAL LOW (ref 40.0–52.0)
Hemoglobin: 8.6 g/dL — ABNORMAL LOW (ref 13.0–18.0)
MCH: 26.7 pg (ref 26.0–34.0)
MCHC: 32.9 g/dL (ref 32.0–36.0)
MCV: 81.2 fL (ref 80.0–100.0)
PLATELETS: 238 10*3/uL (ref 150–440)
RBC: 3.23 MIL/uL — ABNORMAL LOW (ref 4.40–5.90)
RDW: 17.2 % — ABNORMAL HIGH (ref 11.5–14.5)
WBC: 2.1 10*3/uL — ABNORMAL LOW (ref 3.8–10.6)

## 2017-07-18 LAB — HEPATITIS C ANTIBODY: HCV Ab: <0.1 s/co ratio (ref 0.0–0.9)

## 2017-07-18 LAB — GLUCOSE, CAPILLARY: Glucose-Capillary: 94 mg/dL (ref 65–99)

## 2017-07-18 LAB — CHLAMYDIA/NGC RT PCR (ARMC ONLY)
Chlamydia Tr: NOT DETECTED
N gonorrhoeae: NOT DETECTED

## 2017-07-18 LAB — HEPATITIS B VIRUS (PROFILE VI)
HEP B E AB: NEGATIVE
HEP B S AB: REACTIVE
Hep B C IgM: NEGATIVE
Hep B Core Total Ab: NEGATIVE
Hep B E Ag: NEGATIVE
Hepatitis B Surface Ag: NEGATIVE

## 2017-07-18 LAB — HEPATITIS A ANTIBODY, TOTAL: HEP A TOTAL AB: POSITIVE — AB

## 2017-07-18 LAB — RPR: RPR Ser Ql: NONREACTIVE

## 2017-07-18 MED ORDER — FLUCONAZOLE 100 MG PO TABS
100.0000 mg | ORAL_TABLET | Freq: Every day | ORAL | Status: DC
  Administered 2017-07-18 – 2017-07-19 (×2): 100 mg via ORAL
  Filled 2017-07-18 (×2): qty 1

## 2017-07-18 MED ORDER — FLUCONAZOLE IN SODIUM CHLORIDE 100-0.9 MG/50ML-% IV SOLN
100.0000 mg | INTRAVENOUS | Status: DC
  Filled 2017-07-18: qty 50

## 2017-07-18 MED ORDER — SODIUM CHLORIDE 0.9 % IV BOLUS (SEPSIS)
1000.0000 mL | Freq: Once | INTRAVENOUS | Status: AC
  Administered 2017-07-18: 1000 mL via INTRAVENOUS

## 2017-07-18 MED ORDER — SODIUM CHLORIDE 0.9 % IV SOLN
INTRAVENOUS | Status: DC
  Administered 2017-07-18: 20:00:00 via INTRAVENOUS

## 2017-07-18 NOTE — Progress Notes (Addendum)
Pharmacy Antibiotic Note  Jeremiah SaltsKyree Reed is a 26 y.o. male admitted on 07/16/2017 with PNA and oropharyngeal candidiasis. Pharmacy has been consulted for Bactrim, fluconazole, ceftriaxone and azithromycin dosing. Started on fluconazole for thrush and bactrim for possible PCP. Additionally, patient has new diagnosis of HIV and HepA.   Plan: 1. Bactrim 20 mg/kg/day IV (347.2 mg Q6H)  2. Fluconazole 100 mg IV Q24H - Transitioning patient to 100 mg from 200mg  and will speak to provider about possible switch to oral therapy  3. Ceftriaxone 1 g IV Q24H  4. Azithromycin 500mg  PO daily  Height: 5\' 3"  (160 cm) Weight: 153 lb (69.4 kg) IBW/kg (Calculated) : 56.9  Temp (24hrs), Avg:99.2 F (37.3 C), Min:99 F (37.2 C), Max:99.3 F (37.4 C)   Recent Labs Lab 07/16/17 0743 07/16/17 1241 07/16/17 1448 07/17/17 0427 07/18/17 0420  WBC 3.6*  --   --  4.3 2.1*  CREATININE 0.85  --   --  0.68  --   LATICACIDVEN  --  0.7 1.0  --   --     Estimated Creatinine Clearance: 122.5 mL/min (by C-G formula based on SCr of 0.68 mg/dL).    No Known Allergies    Microbiology results: 8/1 Sputum: Pending   Thank you for allowing pharmacy to be a part of this patient's care.  Cleopatra CedarStephanie Ozias Dicenzo  Pharmacy Resident  07/18/2017 9:19 AM

## 2017-07-18 NOTE — Progress Notes (Signed)
[facility] -  at [facility]   PATIENT NAME: Jeremiah Reed    MR#:  [id number]  DATE OF BIRTH:  05/17/1991  SUBJECTIVE:  CHIEF COMPLAINT:   Chief Complaint  Patient presents with  . Abdominal Pain   - Feels better today. Sharp right-sided chest pains have improved. Cough is nonproductive at this time. -HIV came positive, currently on isolation to rule out tuberculosis.  REVIEW OF SYSTEMS:  Review of Systems  Constitutional: Positive for malaise/fatigue. Negative for chills and fever.  HENT: Negative for ear discharge, ear pain, hearing loss and nosebleeds.   Eyes: Negative for blurred vision and double vision.  Respiratory: Positive for cough. Negative for shortness of breath and wheezing.   Cardiovascular: Negative for chest pain, palpitations and leg swelling.  Gastrointestinal: Negative for abdominal pain, constipation, diarrhea, nausea and vomiting.  Genitourinary: Negative for dysuria.  Musculoskeletal: Negative for myalgias.  Neurological: Negative for dizziness, speech change, focal weakness, seizures and headaches.  Psychiatric/Behavioral: Negative for depression.    DRUG ALLERGIES:  No Known Allergies  VITALS:  Blood pressure 130/79, pulse (!) 104, temperature 98.2 F (36.8 C), temperature source Oral, resp. rate 18, height 5\' 3"  (1.6 m), weight 69.4 kg (153 lb), SpO2 100 %.  PHYSICAL EXAMINATION:  Physical Exam  GENERAL:  26 y.o.-year-old ill nourished patient sitting in the bed with no acute distress.  EYES: Pupils equal, round, reactive to light and accommodation. No scleral icterus. Extraocular muscles intact.  HEENT: Head atraumatic, normocephalic. Oropharynx and nasopharynx clear. Oral thrush noted. NECK:  Supple, no jugular venous distention. No thyroid enlargement, no tenderness. Submandibular and anterior cervical lymph nodes are palpable. LUNGS: Normal breath sounds bilaterally, no wheezing, rales,rhonchi or crepitation. No use  of accessory muscles of respiration. Fine bibasilar rhonchi CARDIOVASCULAR: S1, S2 normal. No murmurs, rubs, or gallops.  ABDOMEN: Soft, nontender, nondistended. Bowel sounds present. No organomegaly or mass.  EXTREMITIES: No pedal edema, cyanosis, or clubbing.  NEUROLOGIC: Cranial nerves II through XII are intact. Muscle strength 5/5 in all extremities. Sensation intact. Gait not checked.  PSYCHIATRIC: The patient is alert and oriented x 3.  SKIN: No obvious rash, lesion, or ulcer.    LABORATORY PANEL:   CBC  Recent Labs Lab 07/18/17 0420  WBC 2.1*  HGB 8.6*  HCT 26.2*  PLT 238   ------------------------------------------------------------------------------------------------------------------  Chemistries   Recent Labs Lab 07/16/17 0743 07/17/17 0427  NA 138 138  K 3.3* 3.8  CL 104 110  CO2 26 24  GLUCOSE 83 103*  BUN 9 7  CREATININE 0.85 0.68  CALCIUM 8.8* 7.9*  AST 50*  --   ALT 31  --   ALKPHOS 46  --   BILITOT 0.7  --    ------------------------------------------------------------------------------------------------------------------  Cardiac Enzymes  Recent Labs Lab 07/16/17 0743  TROPONINI <0.03   ------------------------------------------------------------------------------------------------------------------  RADIOLOGY:  No results found.  EKG:   Orders placed or performed during the hospital encounter of 07/16/17  . ED EKG  . ED EKG  . EKG 12-Lead  . EKG 12-Lead  . ED EKG 12-Lead  . ED EKG 12-Lead    ASSESSMENT AND PLAN:   26 year old male with no past medical history presents to hospital secondary to chest pain and cough and noted to have bilateral pneumonia.  #1 multilobar pneumonia-treated for bronchitis as outpatient 3 weeks ago, now presenting with pain and cough -CT chest with no pulmonary embolism but has multilobar pneumonia worse on his left base. -negative blood cultures. on Rocephin  and azithromycin. -Not requiring  supplemental oxygen. Since HIV positive- being treated with bactrim for PCP as well - sputum ordered for AFB. Respiratory to collect induced sputum- at least one AFB needs to be negative prior to discharge -Appreciate infectious disease consult -Since clinically improving and not hypoxic, hopefully will not need to bronchoscopy at this time. Continue to monitor  #2 oral thrush-started on oral fluconazole at this time  #3 HIV infection-appreciate ID consult. PCR and viral load pending at this time. Hep A antibodies positive, immune to hepatitis A. Hepatitis C negative -Will need HAART to be started as outpatient  #4 Anemia- likely chronic, iron deficiency noted. No active bleeding, venofer x 3 doses and oral iron, check stool occult and stool for parasites -Could be HIV-induced bone marrow suppression. If does not improve after initiating HAART treatment, will need hematology consult as outpatient  #5 DVT prophylaxis- lovenox    All the records are reviewed and case discussed with Care Management/Social Workerr. Management plans discussed with the patient, family and they are in agreement.  CODE STATUS: Full Code  TOTAL TIME TAKING CARE OF THIS PATIENT: 37 minutes.   POSSIBLE D/C IN  1-2  DAYS, DEPENDING ON CLINICAL CONDITION.   Lue Sykora M.D on 07/18/2017 at 3:10 PM  Between 7am to 6pm - Pager - [phone]  After 6pm go to [email or web address] - password [email or web address]  [facility]  Office  [phone]  CC: Primary care physician; Patient, No Pcp Per

## 2017-07-18 NOTE — Progress Notes (Signed)
Pnt had an uneventful night. No issues or concerns. Pnt has denied any pain or discomfort during this shift. Airborne precautions remain in place. Will continue to monitor.

## 2017-07-19 ENCOUNTER — Inpatient Hospital Stay: Payer: Self-pay

## 2017-07-19 DIAGNOSIS — J189 Pneumonia, unspecified organism: Secondary | ICD-10-CM

## 2017-07-19 DIAGNOSIS — R61 Generalized hyperhidrosis: Secondary | ICD-10-CM

## 2017-07-19 DIAGNOSIS — R5383 Other fatigue: Secondary | ICD-10-CM

## 2017-07-19 DIAGNOSIS — Z21 Asymptomatic human immunodeficiency virus [HIV] infection status: Secondary | ICD-10-CM

## 2017-07-19 DIAGNOSIS — D61818 Other pancytopenia: Secondary | ICD-10-CM

## 2017-07-19 DIAGNOSIS — A419 Sepsis, unspecified organism: Principal | ICD-10-CM

## 2017-07-19 DIAGNOSIS — R5381 Other malaise: Secondary | ICD-10-CM

## 2017-07-19 DIAGNOSIS — R1011 Right upper quadrant pain: Secondary | ICD-10-CM

## 2017-07-19 DIAGNOSIS — Z79899 Other long term (current) drug therapy: Secondary | ICD-10-CM

## 2017-07-19 DIAGNOSIS — R634 Abnormal weight loss: Secondary | ICD-10-CM

## 2017-07-19 DIAGNOSIS — D72810 Lymphocytopenia: Secondary | ICD-10-CM

## 2017-07-19 LAB — BASIC METABOLIC PANEL
ANION GAP: 7 (ref 5–15)
BUN: 5 mg/dL — ABNORMAL LOW (ref 6–20)
CHLORIDE: 109 mmol/L (ref 101–111)
CO2: 25 mmol/L (ref 22–32)
Calcium: 8 mg/dL — ABNORMAL LOW (ref 8.9–10.3)
Creatinine, Ser: 0.82 mg/dL (ref 0.61–1.24)
GFR calc non Af Amer: >60 mL/min (ref >60)
Glucose, Bld: 122 mg/dL — ABNORMAL HIGH (ref 65–99)
POTASSIUM: 3.3 mmol/L — AB (ref 3.5–5.1)
Sodium: 141 mmol/L (ref 135–145)

## 2017-07-19 LAB — CULTURE, RESPIRATORY

## 2017-07-19 LAB — CBC
HCT: 23.6 % — ABNORMAL LOW (ref 40.0–52.0)
HEMOGLOBIN: 7.8 g/dL — AB (ref 13.0–18.0)
MCH: 26.7 pg (ref 26.0–34.0)
MCHC: 33 g/dL (ref 32.0–36.0)
MCV: 80.7 fL (ref 80.0–100.0)
Platelets: 214 10*3/uL (ref 150–440)
RBC: 2.93 MIL/uL — AB (ref 4.40–5.90)
RDW: 17.1 % — ABNORMAL HIGH (ref 11.5–14.5)
WBC: 1.5 10*3/uL — AB (ref 3.8–10.6)

## 2017-07-19 LAB — PROTIME-INR
INR: 1.12
Prothrombin Time: 14.5 seconds (ref 11.4–15.2)

## 2017-07-19 LAB — CULTURE, RESPIRATORY W GRAM STAIN: Culture: NORMAL

## 2017-07-19 LAB — APTT: APTT: 47 s — AB (ref 24–36)

## 2017-07-19 LAB — CRYPTOCOCCUS ANTIGEN, SERUM: Cryptococcus Antigen, Serum: NEGATIVE

## 2017-07-19 LAB — PROCALCITONIN: Procalcitonin: <0.1 ng/mL

## 2017-07-19 MED ORDER — POTASSIUM CHLORIDE CRYS ER 20 MEQ PO TBCR
40.0000 meq | EXTENDED_RELEASE_TABLET | Freq: Once | ORAL | Status: AC
  Administered 2017-07-19: 40 meq via ORAL
  Filled 2017-07-19: qty 2

## 2017-07-19 MED ORDER — POLYETHYLENE GLYCOL 3350 17 G PO PACK
17.0000 g | PACK | Freq: Every day | ORAL | Status: DC
  Administered 2017-07-19 – 2017-07-20 (×2): 17 g via ORAL
  Filled 2017-07-19 (×2): qty 1

## 2017-07-19 MED ORDER — LEVOFLOXACIN 750 MG PO TABS
750.0000 mg | ORAL_TABLET | Freq: Every day | ORAL | Status: DC
  Administered 2017-07-20: 750 mg via ORAL
  Filled 2017-07-19 (×2): qty 1

## 2017-07-19 MED ORDER — NYSTATIN 100000 UNIT/ML MT SUSP
5.0000 mL | Freq: Four times a day (QID) | OROMUCOSAL | Status: DC
  Administered 2017-07-19 – 2017-07-20 (×4): 500000 [IU] via ORAL
  Filled 2017-07-19 (×4): qty 5

## 2017-07-19 MED ORDER — LACTULOSE 10 GM/15ML PO SOLN
20.0000 g | Freq: Once | ORAL | Status: AC
  Administered 2017-07-19: 20 g via ORAL
  Filled 2017-07-19: qty 30

## 2017-07-19 MED ORDER — SULFAMETHOXAZOLE-TRIMETHOPRIM 400-80 MG PO TABS
2.0000 | ORAL_TABLET | Freq: Three times a day (TID) | ORAL | Status: DC
  Administered 2017-07-19 – 2017-07-20 (×2): 2 via ORAL
  Filled 2017-07-19 (×4): qty 2

## 2017-07-19 NOTE — Progress Notes (Signed)
[facility] NOTE Date of Admission:  07/16/2017     ID: Valerie SaltsKyree Riggi is a 26 y.o. male with  HIV,  PNA Active Problems:   Sepsis (HCC)   Subjective: No fevers, cant produce sputum. Feels better. Some constipation   ROS  Eleven systems are reviewed and negative except per hpi  Medications:  Antibiotics Given (last 72 hours)    Date/Time Action Medication Dose Rate   07/17/17 1031 New Bag/Given   cefTRIAXone (ROCEPHIN) 1 g in dextrose 5 % 50 mL IVPB 1 g 120 mL/hr   07/17/17 1034 New Bag/Given   azithromycin (ZITHROMAX) 500 mg in dextrose 5 % 250 mL IVPB 500 mg 250 mL/hr   07/17/17 1810 New Bag/Given   sulfamethoxazole-trimethoprim (BACTRIM) 347.2 mg in dextrose 5 % 500 mL IVPB 347.2 mg 347.8 mL/hr   07/18/17 0045 New Bag/Given   sulfamethoxazole-trimethoprim (BACTRIM) 347.2 mg in dextrose 5 % 500 mL IVPB 347.2 mg 347.8 mL/hr   07/18/17 [id number] New Bag/Given   sulfamethoxazole-trimethoprim (BACTRIM) 347.2 mg in dextrose 5 % 500 mL IVPB 347.2 mg 347.8 mL/hr   07/18/17 1000 New Bag/Given   cefTRIAXone (ROCEPHIN) 1 g in dextrose 5 % 50 mL IVPB 1 g 120 mL/hr   07/18/17 1401 New Bag/Given   sulfamethoxazole-trimethoprim (BACTRIM) 347.2 mg in dextrose 5 % 500 mL IVPB 347.2 mg 347.8 mL/hr   07/18/17 2045 New Bag/Given   sulfamethoxazole-trimethoprim (BACTRIM) 347.2 mg in dextrose 5 % 500 mL IVPB 347.2 mg 347.8 mL/hr   07/19/17 0403 New Bag/Given  [med not available at time due]   sulfamethoxazole-trimethoprim (BACTRIM) 347.2 mg in dextrose 5 % 500 mL IVPB 347.2 mg 347.8 mL/hr   07/19/17 0800 New Bag/Given   sulfamethoxazole-trimethoprim (BACTRIM) 347.2 mg in dextrose 5 % 500 mL IVPB 347.2 mg 347.8 mL/hr   07/19/17 0907 New Bag/Given   cefTRIAXone (ROCEPHIN) 1 g in dextrose 5 % 50 mL IVPB 1 g 120 mL/hr   07/19/17 0907 Given   azithromycin (ZITHROMAX) tablet 500 mg 500 mg      . azithromycin  500 mg Oral Daily  . enoxaparin (LOVENOX) injection  40 mg  Subcutaneous Q24H  . famotidine  20 mg Oral Daily  . ferrous sulfate  325 mg Oral BID WC  . fluconazole  100 mg Oral Daily  . polyethylene glycol  17 g Oral Daily    Objective: Vital signs in last 24 hours: Temp:  [98.3 F (36.8 C)-99.6 F (37.6 C)] 99.6 F (37.6 C) (08/03 1202) Pulse Rate:  [82-97] 87 (08/03 1202) Resp:  [18-20] 20 (08/03 1202) BP: (114-131)/(67-76) 122/76 (08/03 1202) SpO2:  [96 %-99 %] 99 % (08/03 1202) Constitutional: He is oriented to person, place, and time. He appears thin, No distress.  HENT: anicteric, pale Mouth/Throat: Oropharynx - + thrush on hard palate Cardiovascular: Normal rate, regular rhythm and normal heart sounds..  Pulmonary/Chest: bil rhonchi Abdominal: Soft. Bowel sounds are normal. He exhibits no distension. There is no tenderness.  Lymphadenopathy: He has no cervical adenopathy.  Neurological: He is alert and oriented to person, place, and time.  Skin: Skin is warm and dry. No rash noted. No erythema.  Psychiatric: He has a normal mood and affect. His behavior is normal.  Lab Results  Recent Labs  07/17/17 0427  07/18/17 0420 07/19/17 0423  WBC 4.3  < > 2.1* 1.5*  HGB 8.5*  < > 8.6* 7.8*  HCT 25.5*  < > 26.2* 23.6*  NA 138  --   --  141  K 3.8  --   --  3.3*  CL 110  --   --  109  CO2 24  --   --  25  BUN 7  --   --  5*  CREATININE 0.68  --   --  0.82  < > = values in this interval not displayed.  Microbiology: Results for orders placed or performed during the hospital encounter of 07/16/17  Blood Culture (routine x 2)     Status: None (Preliminary result)   Collection Time: 07/16/17 12:41 PM  Result Value Ref Range Status   Specimen Description BLOOD RIGHT [facility]  Final   Special Requests   Final    BOTTLES DRAWN AEROBIC AND ANAEROBIC Blood Culture results may not be optimal due to an excessive volume of blood received in culture bottles   Culture NO GROWTH 3 DAYS  Final   Report Status PENDING  Incomplete  Blood Culture  (routine x 2)     Status: None (Preliminary result)   Collection Time: 07/16/17 12:41 PM  Result Value Ref Range Status   Specimen Description BLOOD LEFT AC  Final   Special Requests   Final    BOTTLES DRAWN AEROBIC AND ANAEROBIC Blood Culture adequate volume   Culture NO GROWTH 3 DAYS  Final   Report Status PENDING  Incomplete  Culture, sputum-assessment     Status: None   Collection Time: 07/17/17  3:32 AM  Result Value Ref Range Status   Specimen Description SPU  Final   Special Requests NONE  Final   Sputum evaluation THIS SPECIMEN IS ACCEPTABLE FOR SPUTUM CULTURE  Final   Report Status 07/17/2017 FINAL  Final  Culture, respiratory (NON-Expectorated)     Status: None   Collection Time: 07/17/17  3:32 AM  Result Value Ref Range Status   Specimen Description SPU  Final   Special Requests NONE Reflexed from [id number]  Final   Gram Stain   Final    RARE WBC PRESENT, PREDOMINANTLY PMN MODERATE GRAM POSITIVE COCCI IN PAIRS IN CLUSTERS FEW GRAM POSITIVE RODS FEW GRAM NEGATIVE RODS RARE YEAST    Culture   Final    Consistent with normal respiratory flora. Performed at [facility], [address] [place], [place] [address]    Report Status 07/19/2017 FINAL  Final  Chlamydia/NGC rt PCR ([facility] only)     Status: None   Collection Time: 07/17/17  7:00 PM  Result Value Ref Range Status   Specimen source GC/Chlam URINE, RANDOM  Final   Chlamydia Tr NOT DETECTED NOT DETECTED Final   N gonorrhoeae NOT DETECTED NOT DETECTED Final    Comment: (NOTE) 100  This methodology has not been evaluated in pregnant women or in 200  patients with a history of hysterectomy. 300 400  This methodology will not be performed on patients less than 7  years of age.     Studies/Results: Dg Chest 2 View  Result Date: 07/19/2017 CLINICAL DATA:  History of pneumonia.  Shortness of breath . EXAM: CHEST  2 VIEW COMPARISON:  CT chest x-ray 07/16/2017. FINDINGS: Mediastinum and hilar structures normal.  Heart size stable. 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 No interim change. No pleural effusion or pneumothorax. No acute bony abnormality. IMPRESSION: Persistent multifocal multinodular infiltrates. No change from prior exam. Electronically Signed   By: Maisie Fus  Register   On: 07/19/2017 08:18    Assessment/Plan: Sammie Denner is a 26 y.o. male with bil pna and newly  dxed HIV. Has thrush on exam so CD4 likely < 200.  He has had a 20# wt loss, night sweats and now cough. Has diarrhea as well after episodes of constipation. No evidence meningitis. I suspect pulm process is either routine bacterial Pna (strep PNA), PCP or TB so need to rule out with 3 AFB.  He tells me he has family support and is taking the diagnosis well. He has discussed with his mother and I offered to answer questions if needed. I told him [facility] and haved ask our SW Laurette SchimkeJulie Willis to contact him.   Will try to start ART ASAP once clinically improving from the PNA Sputum nml flora. CD4 - 43/7%. VL  Hep A B immune. Hep C neg RPR and GC neg Aug 3 - unable to produce sputum. No fevers, breathing better.    Recommendations Can dc isolation since unable to produce sputum  Change fluconazole to nystatin for 10 days Change ctx and azithro to levoflox for 5 more days Cont Bactrim po for PCP treatment for 21 days. No need for steroids since no hypoxia I can see Thursday in clinic Can dc today or tomorrow if stable  Thank you very much for the consult. Will follow with you.  Mick SellDavid P Holland Kotter   07/19/2017, 2:14 PM

## 2017-07-19 NOTE — Consult Note (Signed)
Hematology/Oncology Consult note [facility] [phone] [phone]  Patient Care Team: Patient, No Pcp Per as PCP - General (General Practice)   Name of the patient: Jeremiah Reed  [id number]  [birth year]    Reason for consult: Anemia   Referring physician- Dr. Tressia Miners  Date of visit: 07/19/2017    History of presenting illness- patient is a 26 year old male who was admitted to the hospital with 4 week history of shortness of breath cough and fever with chills. He was treated with azithromycin a couple of months ago. On admission patient was found to have a white count of 3. CT scan of the chest showed multifocal pneumonia. No evidence of mediastinal hilar or axillary adenopathy. He was tested positive for HIV. He has already been seen by infectious disease who plans to start him on antiretroviral therapy soon after his acute episode of pneumonia improves He does have a history of ongoing night sweats and 20 pound weight loss. He is bisexual but does not know if any of his sex partners have HIV. We have been consult for his anemia.  Today's white count is 1.5, H&H 7.8/23.6 and a platelet count of 214. On 07/16/2017 his white count was 3.6 and on 07/17/2017 his white count was 4.3. Patient had a white count of 1.9 in May 2018 which eventually recovered between 3-4 the following couple of months. Ferritin levels were elevated at 2305. Iron studies showed low iron saturation of 7%. B12 was normal and Folate was normal at 22. Hepatitis A antibody positive. No evidence of hep B infection. Testing for hep C was negative. Reticulocyte count low at 0.7% indicative of hypoproliferative anemia  Currently patient reports feeling better since admission. He had right chest wall pain that has resolved. No sob. He is saturating well on room air and likely to be discharged tomorrow. No fevers  ECOG PS- 0  Pain scale- 0   Review of systems- Review of Systems    Constitutional: Positive for malaise/fatigue. Negative for chills, fever and weight loss.  HENT: Negative for congestion, ear discharge and nosebleeds.   Eyes: Negative for blurred vision.  Respiratory: Negative for cough, hemoptysis, sputum production, shortness of breath and wheezing.   Cardiovascular: Negative for chest pain, palpitations, orthopnea and claudication.  Gastrointestinal: Negative for abdominal pain, blood in stool, constipation, diarrhea, heartburn, melena, nausea and vomiting.  Genitourinary: Negative for dysuria, flank pain, frequency, hematuria and urgency.  Musculoskeletal: Negative for back pain, joint pain and myalgias.  Skin: Negative for rash.  Neurological: Negative for dizziness, tingling, focal weakness, seizures, weakness and headaches.  Endo/Heme/Allergies: Does not bruise/bleed easily.  Psychiatric/Behavioral: Negative for depression and suicidal ideas. The patient does not have insomnia.     No Known Allergies  Patient Active Problem List   Diagnosis Date Noted  . Sepsis ([place]) 07/16/2017     Past Medical History:  Diagnosis Date  . Heart murmur      Past Surgical History:  Procedure Laterality Date  . APPENDECTOMY      Social History   Social History  . Marital status: Single    Spouse name: N/A  . Number of children: N/A  . Years of education: N/A   Occupational History  . Not on file.   Social History Main Topics  . Smoking status: Never Smoker  . Smokeless tobacco: Never Used  . Alcohol use Yes  . Drug use: No  . Sexual activity: Not on file   Other Topics Concern  .  Not on file   Social History Narrative  . No narrative on file     History reviewed. No pertinent family history.   Current Facility-Administered Medications:  .  0.9 %  sodium chloride infusion, , Intravenous, Continuous, Gouru, Aruna, MD, Last Rate: 100 mL/hr at 07/19/17 0332 .  acetaminophen (TYLENOL) tablet 650 mg, 650 mg, Oral, Q6H PRN, 650 mg at  07/17/17 0344 **OR** acetaminophen (TYLENOL) suppository 650 mg, 650 mg, Rectal, Q6H PRN, Mody, Sital, MD .  azithromycin (ZITHROMAX) tablet 500 mg, 500 mg, Oral, Daily, Tressia Miners, Radhika, MD, 500 mg at 07/18/17 0958 .  cefTRIAXone (ROCEPHIN) 1 g in dextrose 5 % 50 mL IVPB, 1 g, Intravenous, Q24H, Mody, Sital, MD, Stopped at 07/18/17 1030 .  enoxaparin (LOVENOX) injection 40 mg, 40 mg, Subcutaneous, Q24H, Mody, Sital, MD, 40 mg at 07/18/17 1413 .  famotidine (PEPCID) tablet 20 mg, 20 mg, Oral, Daily, Gladstone Lighter, MD, 20 mg at 07/18/17 0958 .  ferrous sulfate tablet 325 mg, 325 mg, Oral, BID WC, Kalisetti, Radhika, MD, 325 mg at 07/18/17 1634 .  fluconazole (DIFLUCAN) tablet 100 mg, 100 mg, Oral, Daily, Tressia Miners, Radhika, MD, 100 mg at 07/18/17 1525 .  HYDROcodone-acetaminophen (NORCO/VICODIN) 5-325 MG per tablet 1-2 tablet, 1-2 tablet, Oral, Q4H PRN, Mody, Sital, MD .  iron sucrose (VENOFER) 200 mg in sodium chloride 0.9 % 150 mL IVPB, 200 mg, Intravenous, Q24H, Gladstone Lighter, MD, Stopped at 07/18/17 1515 .  ondansetron (ZOFRAN) tablet 4 mg, 4 mg, Oral, Q6H PRN **OR** ondansetron (ZOFRAN) injection 4 mg, 4 mg, Intravenous, Q6H PRN, Benjie Karvonen, Sital, MD, 4 mg at 07/16/17 2359 .  potassium chloride SA (K-DUR,KLOR-CON) CR tablet 40 mEq, 40 mEq, Oral, Once, Gladstone Lighter, MD .  senna-docusate (Senokot-S) tablet 1 tablet, 1 tablet, Oral, QHS PRN, Bettey Costa, MD, 1 tablet at 07/19/17 0403 .  sulfamethoxazole-trimethoprim (BACTRIM) 347.2 mg in dextrose 5 % 500 mL IVPB, 20 mg/kg/day, Intravenous, Q6H, Leonel Ramsay, MD, Stopped at 07/19/17 0533   Physical exam:  Vitals:   07/18/17 1700 07/18/17 1711 07/18/17 1955 07/19/17 0436  BP: 131/76 124/70 121/71 114/67  Pulse: 97 95 88 82  Resp: 20 20 18 19   Temp:  98.3 F (36.8 C) 98.7 F (37.1 C) 98.3 F (36.8 C)  TempSrc:  Oral Oral Oral  SpO2: 98% 99% 96% 97%  Weight:      Height:       Physical Exam  Constitutional: He is  oriented to person, place, and time.  Thin young gentleman in no acute distress  HENT:  Head: Normocephalic and atraumatic.  Eyes: Pupils are equal, round, and reactive to light. EOM are normal.  Neck: Normal range of motion.  Cardiovascular: Normal rate, regular rhythm and normal heart sounds.   Pulmonary/Chest: Effort normal and breath sounds normal.  Abdominal: Soft. Bowel sounds are normal.  No palpable splenomegaly  Lymphadenopathy:  No palpable cervical, supraclavicular, axillary or inguinal adenopathy  Neurological: He is alert and oriented to person, place, and time.  Skin: Skin is warm and dry.  Irregular, flaky rash noted around perioral area       CMP Latest Ref Rng & Units 07/19/2017  Glucose 65 - 99 mg/dL 122(H)  BUN 6 - 20 mg/dL 5(L)  Creatinine 0.61 - 1.24 mg/dL 0.82  Sodium 135 - 145 mmol/L 141  Potassium 3.5 - 5.1 mmol/L 3.3(L)  Chloride 101 - 111 mmol/L 109  CO2 22 - 32 mmol/L 25  Calcium 8.9 - 10.3 mg/dL 8.0(L)  Total Protein 6.5 - 8.1 g/dL -  Total Bilirubin 0.3 - 1.2 mg/dL -  Alkaline Phos 38 - 126 U/L -  AST 15 - 41 U/L -  ALT 17 - 63 U/L -   CBC Latest Ref Rng & Units 07/19/2017  WBC 3.8 - 10.6 K/uL 1.5(L)  Hemoglobin 13.0 - 18.0 g/dL 7.8(L)  Hematocrit 40.0 - 52.0 % 23.6(L)  Platelets 150 - 440 K/uL 214    @IMAGES @  Dg Chest 2 View  Result Date: 07/19/2017 CLINICAL DATA:  History of pneumonia.  Shortness of breath . EXAM: CHEST  2 VIEW COMPARISON:  CT chest x-ray 07/16/2017. FINDINGS: Mediastinum and hilar structures normal. Heart size stable. 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 No interim change. No pleural effusion or pneumothorax. No acute bony abnormality. IMPRESSION: Persistent multifocal multinodular infiltrates. No change from prior exam. Electronically Signed   By: Marcello Moores  Register   On: 07/19/2017 08:18   Dg Chest 2 View  Result Date: 07/16/2017 CLINICAL DATA:  On and off right-sided chest pain. EXAM: CHEST  2 VIEW  COMPARISON:  05/08/2017 FINDINGS: There are streaky basal opacities bilaterally. Bronchopneumonia or atelectasis can have this appearance. No edema, effusion, or pneumothorax. Normal heart size and mediastinal contours. IMPRESSION: Bilateral bronchopneumonia or atelectasis in the lower lungs. Electronically Signed   By: Monte Fantasia M.D.   On: 07/16/2017 08:09   Ct Angio Chest Pe W And/or Wo Contrast  Result Date: 07/16/2017 CLINICAL DATA:  Chest pain, elevated D-dimer. EXAM: CT ANGIOGRAPHY CHEST WITH CONTRAST TECHNIQUE: Multidetector CT imaging of the chest was performed using the standard protocol during bolus administration of intravenous contrast. Multiplanar CT image reconstructions and MIPs were obtained to evaluate the vascular anatomy. CONTRAST:  75 cc Isovue 370 IV COMPARISON:  Chest x-ray earlier today FINDINGS: Cardiovascular: No filling defects in the pulmonary arteries to suggest pulmonary emboli. Heart is normal size. Aorta is normal caliber. Mediastinum/Nodes: No mediastinal, hilar, or axillary adenopathy. Trachea and esophagus are unremarkable. Lungs/Pleura: Nodular airspace disease in the right upper lobe, right middle lobe, lingula and left lower lobe most compatible with multifocal pneumonia. No effusions. Upper Abdomen: Imaging into the upper abdomen shows no acute findings. Musculoskeletal: Chest wall soft tissues are unremarkable. No acute bony abnormality. Review of the MIP images confirms the above findings. IMPRESSION: Multifocal nodular airspace disease most compatible with pneumonia. No evidence of pulmonary embolus. Electronically Signed   By: Rolm Baptise M.D.   On: 07/16/2017 11:16   US Abdomen Limited Ruq  Result Date: 07/16/2017 CLINICAL DATA:  Right quadrant pain. EXAM: ULTRASOUND ABDOMEN LIMITED RIGHT UPPER QUADRANT COMPARISON:  06/05/2017. FINDINGS: Gallbladder: No gallstones or wall thickening visualized. No sonographic Murphy sign noted by sonographer. Common bile  duct: Diameter: 2.6 mm Liver: No focal lesion identified. Within normal limits in parenchymal echogenicity. IMPRESSION: No acute or focal abnormality identified. Electronically Signed   By: Marcello Moores  Register   On: 07/16/2017 09:21    Assessment and plan- Patient is a 26 y.o. male with newly diagnosed acute HIv. We have been consulted for lymphopenia and anemia  Reviewed results of anemia workup done so far. Iron studies mainly suggest anemia of chronic disease with some component of iron deficiency given low iron saturation and serum iron. Overall his lymphopenia and anemia likely consistent with acute HIV and pneumonia causing bone marrow suppression. Low retic count is indicative of hypoproliferative anemia. Will check path smear review, copper level, flow cytometry and haptoglobin. Parvovirus DNA PCR pending. Please check cbc with  diff in am.  I will hold off on bone marrow biopsy at this time and monitor his cbc closely. Hopefully it should improve after initiation of HAART therapy. If he has persistent pancytopenia despite treating his HIV as an outpatient, bone marrow biopsy can be considered in the future.   CT chest done 3 days prior did not reveal any lymphadenopathy suggestive of lymphoma. No palpable adenopathy or splenomegaly  Thank you for this consult and the opportunity to participate in the care of this patient. I will follow up with patient as an outpatient 1 week upon discharge and check repeat cbc and f/u above blood work as well   Visit Diagnosis 1. Pneumonia of both lungs due to infectious organism, unspecified part of lung   2. RUQ pain   3. Sepsis, due to unspecified organism ([place])   4. Pneumonia     Dr. Randa Evens, MD, MPH [facility] Pager- [phone] 07/19/2017

## 2017-07-19 NOTE — Progress Notes (Signed)
[facility] [place] [facility]   PATIENT NAME: Jeremiah SaltsKyree Reed    MR#:  [id number]  DATE OF BIRTH:  06/13/1991  SUBJECTIVE:  CHIEF COMPLAINT:   Chief Complaint  Patient presents with  . Abdominal Pain   - Feels worse today, has been constipated for a few days now. -Complains of nausea and dizziness. -Unable to produce sputum enough to test for AFB  REVIEW OF SYSTEMS:  Review of Systems  Constitutional: Positive for malaise/fatigue. Negative for chills and fever.  HENT: Negative for ear discharge, ear pain, hearing loss and nosebleeds.   Eyes: Negative for blurred vision and double vision.  Respiratory: Positive for cough. Negative for shortness of breath and wheezing.   Cardiovascular: Negative for chest pain, palpitations and leg swelling.  Gastrointestinal: Positive for nausea. Negative for abdominal pain, constipation, diarrhea and vomiting.  Genitourinary: Negative for dysuria.  Musculoskeletal: Negative for myalgias.  Neurological: Positive for dizziness. Negative for speech change, focal weakness, seizures and headaches.  Psychiatric/Behavioral: Negative for depression.    DRUG ALLERGIES:  No Known Allergies  VITALS:  Blood pressure 114/67, pulse 82, temperature 98.3 F (36.8 C), temperature source Oral, resp. rate 19, height 5\' 3"  (1.6 m), weight 69.4 kg (153 lb), SpO2 97 %.  PHYSICAL EXAMINATION:  Physical Exam  GENERAL:  26 y.o.-year-old ill nourished patient sitting in the bed with no acute distress.  EYES: Pupils equal, round, reactive to light and accommodation. No scleral icterus. Extraocular muscles intact.  HEENT: Head atraumatic, normocephalic. Oropharynx and nasopharynx clear. Oral thrush noted. NECK:  Supple, no jugular venous distention. No thyroid enlargement, no tenderness. Submandibular and anterior cervical lymph nodes are palpable. LUNGS: Normal breath sounds bilaterally, no wheezing, rales,rhonchi or crepitation. No use of  accessory muscles of respiration. Fine bibasilar rhonchi CARDIOVASCULAR: S1, S2 normal. No murmurs, rubs, or gallops.  ABDOMEN: Soft, nontender, nondistended. Bowel sounds present. No organomegaly or mass.  EXTREMITIES: No pedal edema, cyanosis, or clubbing.  NEUROLOGIC: Cranial nerves II through XII are intact. Muscle strength 5/5 in all extremities. Sensation intact. Gait not checked.  PSYCHIATRIC: The patient is alert and oriented x 3.  SKIN: No obvious rash, lesion, or ulcer.    LABORATORY PANEL:   CBC  Recent Labs Lab 07/19/17 0423  WBC 1.5*  HGB 7.8*  HCT 23.6*  PLT 214   ------------------------------------------------------------------------------------------------------------------  Chemistries   Recent Labs Lab 07/16/17 0743  07/19/17 0423  NA 138  < > 141  K 3.3*  < > 3.3*  CL 104  < > 109  CO2 26  < > 25  GLUCOSE 83  < > 122*  BUN 9  < > 5*  CREATININE 0.85  < > 0.82  CALCIUM 8.8*  < > 8.0*  AST 50*  --   --   ALT 31  --   --   ALKPHOS 46  --   --   BILITOT 0.7  --   --   < > = values in this interval not displayed. ------------------------------------------------------------------------------------------------------------------  Cardiac Enzymes  Recent Labs Lab 07/16/17 0743  TROPONINI <0.03   ------------------------------------------------------------------------------------------------------------------  RADIOLOGY:  Dg Chest 2 View  Result Date: 07/19/2017 CLINICAL DATA:  History of pneumonia.  Shortness of breath . EXAM: CHEST  2 VIEW COMPARISON:  CT chest x-ray 07/16/2017. FINDINGS: Mediastinum and hilar structures normal. Heart size stable. 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 No interim change. No pleural effusion or pneumothorax. No acute bony abnormality. IMPRESSION: Persistent multifocal multinodular infiltrates.  No change from prior exam. Electronically Signed   By: Maisie Fushomas  Register   On: 07/19/2017 08:18    EKG:     Orders placed or performed during the hospital encounter of 07/16/17  . ED EKG  . ED EKG  . EKG 12-Lead  . EKG 12-Lead  . ED EKG 12-Lead  . ED EKG 12-Lead    ASSESSMENT AND PLAN:   26 year old male with no past medical history presents to hospital secondary to chest pain and cough and noted to have bilateral pneumonia.  #1 multilobar pneumonia- failed outpatient treatment for bronchitis 3 weeks ago -CT chest with no pulmonary embolism but has multilobar pneumonia worse on his left base. -negative blood cultures. on Rocephin and azithromycin. -Not requiring supplemental oxygen. Since HIV positive- being treated with bactrim for PCP as well - sputum ordered for AFB. Respiratory to collect induced sputum- at least one AFB needs to be negative prior to discharge -Appreciate infectious disease consult -Since clinically improving and not hypoxic, hopefully will not need to bronchoscopy at this time. Continue to monitor  #2 oral thrush-on oral fluconazole at this time  #3 HIV infection-appreciate ID consult. PCR and viral load pending at this time. Hep A antibodies positive, immune to hepatitis A. Hepatitis C negative -Will need HAART to be started as outpatient  #4 Anemia- likely chronic, iron deficiency noted. No active bleeding, venofer x 3 doses and oral iron,  -check stool occult and stool for parasites -Could be HIV-induced bone marrow suppression. Likely will improve after initiating HAART treatment - hematology consult for leukopenia and anemia  #5 hypokalemia-being replaced  #6 DVT prophylaxis- lovenox  #7 constipation-medications added    All the records are reviewed and case discussed with Care Management/Social Workerr. Management plans discussed with the patient, family and they are in agreement.  CODE STATUS: Full Code  TOTAL TIME TAKING CARE OF THIS PATIENT: 37 minutes.   POSSIBLE D/C IN  1-2  DAYS, DEPENDING ON CLINICAL CONDITION.   Nahum Sherrer M.D  on 07/19/2017 at 9:37 AM  Between 7am to 6pm - Pager - [phone]  After 6pm go to [email or web address] - password [email or web address]  [facility] [place] [facility]  Office  [phone]  CC: Primary care physician; Patient, No Pcp Per

## 2017-07-19 NOTE — Progress Notes (Signed)
Pt coughed up a very small blood tinged saliva specimen.  It was too small to collect.  Not sputum appropriate.

## 2017-07-20 LAB — CBC WITH DIFFERENTIAL/PLATELET
BASOS PCT: 0 %
Basophils Absolute: 0 10*3/uL (ref 0–0.1)
Eosinophils Absolute: 0 10*3/uL (ref 0–0.7)
Eosinophils Relative: 1 %
HEMATOCRIT: 26.7 % — AB (ref 40.0–52.0)
Hemoglobin: 8.8 g/dL — ABNORMAL LOW (ref 13.0–18.0)
LYMPHS ABS: 0.4 10*3/uL — AB (ref 1.0–3.6)
Lymphocytes Relative: 25 %
MCH: 26.6 pg (ref 26.0–34.0)
MCHC: 32.9 g/dL (ref 32.0–36.0)
MCV: 80.9 fL (ref 80.0–100.0)
MONO ABS: 0.2 10*3/uL (ref 0.2–1.0)
MONOS PCT: 14 %
NEUTROS ABS: 0.9 10*3/uL — AB (ref 1.4–6.5)
Neutrophils Relative %: 60 %
Platelets: 261 10*3/uL (ref 150–440)
RBC: 3.3 MIL/uL — ABNORMAL LOW (ref 4.40–5.90)
RDW: 17.6 % — AB (ref 11.5–14.5)
WBC: 1.5 10*3/uL — ABNORMAL LOW (ref 3.8–10.6)

## 2017-07-20 LAB — BASIC METABOLIC PANEL
Anion gap: 7 (ref 5–15)
CALCIUM: 8.6 mg/dL — AB (ref 8.9–10.3)
CHLORIDE: 106 mmol/L (ref 101–111)
CO2: 24 mmol/L (ref 22–32)
CREATININE: 0.89 mg/dL (ref 0.61–1.24)
Glucose, Bld: 82 mg/dL (ref 65–99)
Potassium: 3.5 mmol/L (ref 3.5–5.1)
Sodium: 137 mmol/L (ref 135–145)

## 2017-07-20 LAB — HAPTOGLOBIN: Haptoglobin: 106 mg/dL (ref 34–200)

## 2017-07-20 MED ORDER — SULFAMETHOXAZOLE-TRIMETHOPRIM 400-80 MG PO TABS: 2.0000 | ORAL_TABLET | Freq: Three times a day (TID) | ORAL | 0 refills | Status: DC

## 2017-07-20 MED ORDER — NYSTATIN 100000 UNIT/ML MT SUSP: 5.0000 mL | Freq: Four times a day (QID) | OROMUCOSAL | 0 refills | Status: AC

## 2017-07-20 MED ORDER — FERROUS SULFATE 325 (65 FE) MG PO TABS: 325.0000 mg | ORAL_TABLET | Freq: Two times a day (BID) | ORAL | 3 refills | Status: AC

## 2017-07-20 MED ORDER — SULFAMETHOXAZOLE-TRIMETHOPRIM 400-80 MG PO TABS: 1.0000 | ORAL_TABLET | Freq: Two times a day (BID) | ORAL | 0 refills | Status: DC

## 2017-07-20 MED ORDER — LEVOFLOXACIN 750 MG PO TABS: 750.0000 mg | ORAL_TABLET | Freq: Every day | ORAL | 0 refills | Status: DC

## 2017-07-20 NOTE — Progress Notes (Signed)
Pnt has rested this shift. Denies any pain or discomfort overnight. Pnt remains on Airborne Precautions. No issues or concerns at this time. Will continue to monitor and assess.

## 2017-07-20 NOTE — Progress Notes (Signed)
[facility] [place] [facility]   PATIENT NAME: Jeremiah SaltsKyree Koffman    MR#:  [id number]  DATE OF BIRTH:  05/16/1991  SUBJECTIVE: pt able to cough  Up  brown phlegm and it is sent to the lab. He denies any other complaints.   CHIEF COMPLAINT:   Chief Complaint  Patient presents with  . Abdominal Pain   -  REVIEW OF SYSTEMS:  Review of Systems  Constitutional: Negative for chills, fever and malaise/fatigue.  HENT: Negative for ear discharge, ear pain, hearing loss and nosebleeds.   Eyes: Negative for blurred vision and double vision.  Respiratory: Negative for cough, shortness of breath and wheezing.   Cardiovascular: Negative for chest pain, palpitations and leg swelling.  Gastrointestinal: Negative for abdominal pain, constipation, diarrhea, nausea and vomiting.  Genitourinary: Negative for dysuria.  Musculoskeletal: Negative for myalgias.  Neurological: Negative for dizziness, speech change, focal weakness, seizures and headaches.  Psychiatric/Behavioral: Negative for depression.    DRUG ALLERGIES:  No Known Allergies  VITALS:  Blood pressure 117/67, pulse 93, temperature 98.7 F (37.1 C), temperature source Oral, resp. rate 20, height 5\' 3"  (1.6 m), weight 69.4 kg (153 lb), SpO2 99 %.  PHYSICAL EXAMINATION:  Physical Exam  GENERAL:  26 y.o.-year-old ill nourished patient sitting in the bed with no acute distress.  EYES: Pupils equal, round, reactive to light and accommodation. No scleral icterus. Extraocular muscles intact.  HEENT: Head atraumatic, normocephalic. Oropharynx and nasopharynx clear. Oral thrush noted. NECK:  Supple, no jugular venous distention. No thyroid enlargement, no tenderness. Submandibular and anterior cervical lymph nodes are palpable. LUNGS: Normal breath sounds bilaterally, no wheezing, rales,rhonchi or crepitation. No use of accessory muscles of respiration. Fine bibasilar rhonchi CARDIOVASCULAR: S1, S2 normal. No murmurs, rubs,  or gallops.  ABDOMEN: Soft, nontender, nondistended. Bowel sounds present. No organomegaly or mass.  EXTREMITIES: No pedal edema, cyanosis, or clubbing.  NEUROLOGIC: Cranial nerves II through XII are intact. Muscle strength 5/5 in all extremities. Sensation intact. Gait not checked.  PSYCHIATRIC: The patient is alert and oriented x 3.  SKIN: No obvious rash, lesion, or ulcer.    LABORATORY PANEL:   CBC  Recent Labs Lab 07/20/17 0746  WBC 1.5*  HGB 8.8*  HCT 26.7*  PLT 261   ------------------------------------------------------------------------------------------------------------------  Chemistries   Recent Labs Lab 07/16/17 0743  07/20/17 0746  NA 138  < > 137  K 3.3*  < > 3.5  CL 104  < > 106  CO2 26  < > 24  GLUCOSE 83  < > 82  BUN 9  < > <5*  CREATININE 0.85  < > 0.89  CALCIUM 8.8*  < > 8.6*  AST 50*  --   --   ALT 31  --   --   ALKPHOS 46  --   --   BILITOT 0.7  --   --   < > = values in this interval not displayed. ------------------------------------------------------------------------------------------------------------------  Cardiac Enzymes  Recent Labs Lab 07/16/17 0743  TROPONINI <0.03   ------------------------------------------------------------------------------------------------------------------  RADIOLOGY:  Dg Chest 2 View  Result Date: 07/19/2017 CLINICAL DATA:  History of pneumonia.  Shortness of breath . EXAM: CHEST  2 VIEW COMPARISON:  CT chest x-ray 07/16/2017. FINDINGS: Mediastinum and hilar structures normal. Heart size stable. 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 No interim change. No pleural effusion or pneumothorax. No acute bony abnormality. IMPRESSION: Persistent multifocal multinodular infiltrates. No change from prior exam. Electronically Signed   By: Maisie Fushomas  Register   On: 07/19/2017 08:18    EKG:   Orders placed or performed during the hospital encounter of 07/16/17  . ED EKG  . ED EKG  . EKG 12-Lead   . EKG 12-Lead  . ED EKG 12-Lead  . ED EKG 12-Lead    ASSESSMENT AND PLAN:   26 year old male with no past medical history presents to hospital secondary to chest pain and cough and noted to have bilateral pneumonia.  #1 multilobar pneumonia- failed outpatient treatment for bronchitis 3 weeks ago;Patient had some brown sputum and it was sent to the lab for AFB, cultures. I spoke with Dr. Sampson GoonFitzgerald over the phone and he recommended patient can be discharged and he will follow  Sputum culture  result. Patient told me that he had only a little bit phlegm and has no cough at all. And he feels well to go home.Marland Kitchen. Discharge home today with Levaquin for 5 days. Patient also needs PCP prophylaxis with Bactrim for 21 days as per ID recommendation. #2 oral thrush ;discharge home with nystatin suspension for 10 days #3 HIV infection-appreciate ID consult. PCR and viral load pending at this time. Hep A antibodies positive, immune to hepatitis A. Hepatitis C negative -Will need HAART to be started as outpatient patient can see Dr. Sampson GoonFitzgerald on Thursday, August 9. His HIV clinic..  #4 Anemia- likely chronic, iron deficiency noted. No active bleeding, venofer x 3 doses and oral iron,  -check stool occult and stool for parasites -Could be HIV-induced bone marrow suppression. Likely will improve after initiating HAART treatment By hematology,parvovirus  DNA PCR, copper level, flow cytometric, haptoglobin are sent and patient can follow up with oncology and hematology as an outpatient.  #5 hypokalemia-being replaced  #6 DVT prophylaxis- lovenox  #7 constipation-medications added    All the records are reviewed and case discussed with Care Management/Social Workerr. Management plans discussed with the patient, family and they are in agreement.  CODE STATUS: Full Code  TOTAL TIME TAKING CARE OF THIS PATIENT: 37 minutes.   POSSIBLE D/C IN  1-2  DAYS, DEPENDING ON CLINICAL  CONDITION.   Katha HammingKONIDENA,Kasey Hansell M.D on 07/20/2017 at 10:26 AM  Between 7am to 6pm - Pager - [phone]  After 6pm go to [email or web address] - password [email or web address]  [facility] [place] [facility]  Office  [phone]  CC: Primary care physician; Patient, No Pcp Per

## 2017-07-20 NOTE — Care Management Note (Signed)
Case Management Note  Patient Details  Name: Jeremiah Reed MRN: [id number] Date of Birth: 11/20/1991  Subjective/Objective:      Provided Jeremiah Reed with a coupon [facility]. [facility].               Action/Plan:   Expected Discharge Date:  07/20/17               Expected Discharge Plan:   07/20/17  In-House Referral:     Discharge planning Services    MATCH coupon Post Acute Care Choice:    Choice offered to:     DME Arranged:    DME Agency:     HH Arranged:    HH Agency:     Status of Service:   Completed. [facility] and [facility] applications given.   If discussed at Long Length of Stay Meetings, dates discussed:    Additional Comments:  Leilan Bochenek A, RN 07/20/2017, 11:43 AM

## 2017-07-20 NOTE — Progress Notes (Signed)
[facility], they will add-on AFB to sputum culture collected on 07/17/17.

## 2017-07-21 LAB — CULTURE, BLOOD (ROUTINE X 2)
CULTURE: NO GROWTH
CULTURE: NO GROWTH
SPECIAL REQUESTS: ADEQUATE

## 2017-07-21 LAB — QUANTIFERON IN TUBE
QUANTIFERON MITOGEN VALUE: 1.04 IU/mL
QUANTIFERON NIL VALUE: 0.07 [IU]/mL
QUANTIFERON TB AG VALUE: 0.06 IU/mL
QUANTIFERON TB GOLD: NEGATIVE

## 2017-07-21 LAB — QUANTIFERON TB GOLD ASSAY (BLOOD)

## 2017-07-22 LAB — ACID FAST SMEAR (AFB): ACID FAST SMEAR - AFSCU2: NEGATIVE

## 2017-07-22 LAB — ACID FAST SMEAR (AFB, MYCOBACTERIA)

## 2017-07-22 LAB — COPPER, SERUM: COPPER: 116 ug/dL (ref 72–166)

## 2017-07-22 NOTE — Discharge Summary (Signed)
Jeremiah SaltsKyree Fonder, is a 26 y.o. male  DOB 12/01/1991  MRN [id number].  Admission date:  07/16/2017  Admitting Physician  Adrian SaranSital Mody, MD  Discharge Date:  07/20/2017   Primary MD  Patient, No Pcp Per  Recommendations for primary care physician for things to follow:   Follow-up with Dr. Sampson GoonFitzgerald on August 9 in his HIV clinic.   Admission Diagnosis  RUQ pain [R10.11] Sepsis, due to unspecified organism (HCC) [A41.9] Pneumonia of both lungs due to infectious organism, unspecified part of lung [J18.9]   Discharge Diagnosis  RUQ pain [R10.11] Sepsis, due to unspecified organism (HCC) [A41.9] Pneumonia of both lungs due to infectious organism, unspecified part of lung [J18.9]    Active Problems:   Sepsis [facility](HCC)      Past Medical History:  Diagnosis Date  . Heart murmur     Past Surgical History:  Procedure Laterality Date  . APPENDECTOMY         History of present illness and  Hospital Course:     Kindly see H&P for history of present illness and admission details, please review complete Labs, Consult reports and Test reports for all details in brief  HPI  from the history and physical done on the day of admission 26 year old male patient with no past medical history came in because of chest pain and cough. Noted to have bilateral pneumonia. Patient also had shortness of breath, fever or chills. Admitted to hospitalist service for bilateral pneumonia, started on Rocephin, Zithromax. Failed outpatient therapy with Zithromax 3 weeks ago.   Hospital Course  #1 sepsis present on admission secondary to community-acquired bilateral pneumonia. Received IV fluids, IV antibiotics. Patient had HIV test came back positive. Seen by Dr. Sampson GoonFitzgerald. Patient hepatitis C, is negative.  Patient did not have any sputum to test for AFB.  Dr. Sampson GoonFitzgerald recommended discontinue isolation since patient unable to produce sputum. Patient had no further fevers. Discharge home with 5 days of Levaquin, PCP prophylaxis with Bactrim 1 tablet by mouth twice a day for 21 days, can follow up with Dr. Sampson GoonFitzgerald in his HIV clinic on August 9.  # 2 .oral thrush: Patient received Diflucan. Discharge home with nystatin suspension for 10 days.  #3 anemia likely chronic. And deficiency noted. No active bleeding. Patient received  IV venofer for  3 doses. On oral iron. Discharge home with oral iron. Seen by hematology Dr Ovidio KinArchana Rao,sent lab test for parvovirus DNA PCR,,copper level, flow cytometry, haptoglobin are sent, patient can follow up with  oncology as an outpatient.  #4 hypokalemia: Replaced, improved.    Discharge Condition:stable   Follow UP  Follow-up Information    Mick SellFitzgerald, David P, MD Follow up in 5 day(s).   Specialty:  Infectious Diseases Why:  on aug 9 th at his HIV clinic Contact information: [address] [place] [address] [phone]        Creig Hinesao, Archana C, MD. Schedule an appointment as soon as possible for a visit in 1 week(s).   Specialty:  Oncology Contact information: [address] [place] [address] [phone]        [facility]. Schedule an appointment as soon as possible for a visit in 1 week(s).   Specialty:  Primary Care Why:  Call to make an appointment for follow up & set up primary care provider. Contact information: [address] [place] [id number] [phone]  Discharge Instructions  and  Discharge Medications      Allergies as of 07/20/2017   No Known Allergies     Medication List    STOP taking these medications   magic mouthwash w/lidocaine Soln     TAKE these medications   famotidine 20 MG tablet Commonly known as:  PEPCID Take 1 tablet (20 mg total) by mouth daily.   ferrous  sulfate 325 (65 FE) MG tablet Take 1 tablet (325 mg total) by mouth 2 (two) times daily with a meal.   levofloxacin 750 MG tablet Commonly known as:  LEVAQUIN Take 1 tablet (750 mg total) by mouth daily.   nystatin 100000 UNIT/ML suspension Commonly known as:  MYCOSTATIN Take 5 mLs (500,000 Units total) by mouth 4 (four) times daily.   senna-docusate 8.6-50 MG tablet Commonly known as:  Senokot-S Take 2 tablets by mouth 2 (two) times daily.   sulfamethoxazole-trimethoprim 400-80 MG tablet Commonly known as:  BACTRIM,SEPTRA Take 1 tablet by mouth 2 (two) times daily.   VENTOLIN HFA 108 (90 Base) MCG/ACT inhaler Generic drug:  albuterol Inhale 2 puffs into the lungs every 4 (four) hours as needed.         Diet and Activity recommendation: See Discharge Instructions above   Consults obtained - ID, hematology..   Major procedures and Radiology Reports - PLEASE review detailed and final reports for all details, in brief -      Dg Chest 2 View  Result Date: 07/19/2017 CLINICAL DATA:  History of pneumonia.  Shortness of breath . EXAM: CHEST  2 VIEW COMPARISON:  CT chest x-ray 07/16/2017. FINDINGS: Mediastinum and hilar structures normal. Heart size stable. 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 No interim change. No pleural effusion or pneumothorax. No acute bony abnormality. IMPRESSION: Persistent multifocal multinodular infiltrates. No change from prior exam. Electronically Signed   By: Maisie Fus  Register   On: 07/19/2017 08:18   Dg Chest 2 View  Result Date: 07/16/2017 CLINICAL DATA:  On and off right-sided chest pain. EXAM: CHEST  2 VIEW COMPARISON:  05/08/2017 FINDINGS: There are streaky basal opacities bilaterally. Bronchopneumonia or atelectasis can have this appearance. No edema, effusion, or pneumothorax. Normal heart size and mediastinal contours. IMPRESSION: Bilateral bronchopneumonia or atelectasis in the lower lungs. Electronically Signed   By:  Marnee Spring M.D.   On: 07/16/2017 08:09   Ct Angio Chest Pe W And/or Wo Contrast  Result Date: 07/16/2017 CLINICAL DATA:  Chest pain, elevated D-dimer. EXAM: CT ANGIOGRAPHY CHEST WITH CONTRAST TECHNIQUE: Multidetector CT imaging of the chest was performed using the standard protocol during bolus administration of intravenous contrast. Multiplanar CT image reconstructions and MIPs were obtained to evaluate the vascular anatomy. CONTRAST:  75 cc Isovue 370 IV COMPARISON:  Chest x-ray earlier today FINDINGS: Cardiovascular: No filling defects in the pulmonary arteries to suggest pulmonary emboli. Heart is normal size. Aorta is normal caliber. Mediastinum/Nodes: No mediastinal, hilar, or axillary adenopathy. Trachea and esophagus are unremarkable. Lungs/Pleura: Nodular airspace disease in the right upper lobe, right middle lobe, lingula and left lower lobe most compatible with multifocal pneumonia. No effusions. Upper Abdomen: Imaging into the upper abdomen shows no acute findings. Musculoskeletal: Chest wall soft tissues are unremarkable. No acute bony abnormality. Review of the MIP images confirms the above findings. IMPRESSION: Multifocal nodular airspace disease most compatible with pneumonia. No evidence of pulmonary embolus. Electronically Signed   By: Charlett Nose M.D.   On: 07/16/2017 11:16   US Abdomen Limited Ruq  Result  Date: 07/16/2017 CLINICAL DATA:  Right quadrant pain. EXAM: ULTRASOUND ABDOMEN LIMITED RIGHT UPPER QUADRANT COMPARISON:  06/05/2017. FINDINGS: Gallbladder: No gallstones or wall thickening visualized. No sonographic Murphy sign noted by sonographer. Common bile duct: Diameter: 2.6 mm Liver: No focal lesion identified. Within normal limits in parenchymal echogenicity. IMPRESSION: No acute or focal abnormality identified. Electronically Signed   By: Maisie Fus  Register   On: 07/16/2017 09:21    Micro Results    Recent Results (from the past 240 hour(s))  Blood Culture (routine x  2)     Status: None   Collection Time: 07/16/17 12:41 PM  Result Value Ref Range Status   Specimen Description BLOOD RIGHT [facility]  Final   Special Requests   Final    BOTTLES DRAWN AEROBIC AND ANAEROBIC Blood Culture results may not be optimal due to an excessive volume of blood received in culture bottles   Culture NO GROWTH 5 DAYS  Final   Report Status 07/21/2017 FINAL  Final  Blood Culture (routine x 2)     Status: None   Collection Time: 07/16/17 12:41 PM  Result Value Ref Range Status   Specimen Description BLOOD LEFT AC  Final   Special Requests   Final    BOTTLES DRAWN AEROBIC AND ANAEROBIC Blood Culture adequate volume   Culture NO GROWTH 5 DAYS  Final   Report Status 07/21/2017 FINAL  Final  Culture, sputum-assessment     Status: None   Collection Time: 07/17/17  3:32 AM  Result Value Ref Range Status   Specimen Description SPU  Final   Special Requests NONE  Final   Sputum evaluation THIS SPECIMEN IS ACCEPTABLE FOR SPUTUM CULTURE  Final   Report Status 07/17/2017 FINAL  Final  Culture, respiratory (NON-Expectorated)     Status: None   Collection Time: 07/17/17  3:32 AM  Result Value Ref Range Status   Specimen Description SPU  Final   Special Requests NONE Reflexed from [id number]  Final   Gram Stain   Final    RARE WBC PRESENT, PREDOMINANTLY PMN MODERATE GRAM POSITIVE COCCI IN PAIRS IN CLUSTERS FEW GRAM POSITIVE RODS FEW GRAM NEGATIVE RODS RARE YEAST    Culture   Final    Consistent with normal respiratory flora. Performed at [facility], [address], [place], [place] [address]    Report Status 07/19/2017 FINAL  Final  Chlamydia/NGC rt PCR ([facility] only)     Status: None   Collection Time: 07/17/17  7:00 PM  Result Value Ref Range Status   Specimen source GC/Chlam URINE, RANDOM  Final   Chlamydia Tr NOT DETECTED NOT DETECTED Final   N gonorrhoeae NOT DETECTED NOT DETECTED Final    Comment: (NOTE) 100  This methodology has not been evaluated in pregnant women  or in 200  patients with a history of hysterectomy. 300 400  This methodology will not be performed on patients less than 54  years of age.        Today   Subjective:   Jeremiah Reed today has no headache,no chest abdominal pain,no new weakness tingling or numbness, feels much better wants to go home today.   Objective:   Blood pressure 117/67, pulse 93, temperature 98.7 F (37.1 C), temperature source Oral, resp. rate 20, height 5\' 3"  (1.6 m), weight 69.4 kg (153 lb), SpO2 99 %.  No intake or output data in the 24 hours ending 07/22/17 1047  Exam Awake Alert, Oriented x 3, No new F.N deficits,  Normal affect [place].AT,PERRAL Supple Neck,No JVD, No cervical lymphadenopathy appriciated.  Symmetrical Chest wall movement, Good air movement bilaterally, CTAB RRR,No Gallops,Rubs or new Murmurs, No Parasternal Heave +ve B.Sounds, Abd Soft, Non tender, No organomegaly appriciated, No rebound -guarding or rigidity. No Cyanosis, Clubbing or edema, No new Rash or bruise  Data Review   CBC w Diff:  Lab Results  Component Value Date   WBC 1.5 (L) 07/20/2017   HGB 8.8 (L) 07/20/2017   HGB 7.8 (L) 07/17/2017   HCT 26.7 (L) 07/20/2017   HCT 24.2 (L) 07/17/2017   PLT 261 07/20/2017   PLT 284 07/17/2017   LYMPHOPCT 25 07/20/2017   MONOPCT 14 07/20/2017   EOSPCT 1 07/20/2017   BASOPCT 0 07/20/2017    CMP:  Lab Results  Component Value Date   NA 137 07/20/2017   K 3.5 07/20/2017   CL 106 07/20/2017   CO2 24 07/20/2017   BUN <5 (L) 07/20/2017   CREATININE 0.89 07/20/2017   PROT 7.8 07/16/2017   ALBUMIN 3.6 07/16/2017   BILITOT 0.7 07/16/2017   ALKPHOS 46 07/16/2017   AST 50 (H) 07/16/2017   ALT 31 07/16/2017  .   Total Time in preparing paper work, data evaluation and todays exam - 35 minutes  Anjuli Gemmill M.D on 07/20/2017 at 10:47 AM    Note: This dictation was prepared with Dragon dictation along with smaller phrase technology. Any transcriptional errors that  result from this process are unintentional.

## 2017-07-23 LAB — COMP PANEL: LEUKEMIA/LYMPHOMA

## 2017-07-23 LAB — HUMAN PARVOVIRUS DNA DETECTION BY PCR: Parvovirus B19, PCR: NEGATIVE

## 2017-07-26 ENCOUNTER — Emergency Department: Payer: Self-pay

## 2017-07-26 ENCOUNTER — Inpatient Hospital Stay: Payer: Self-pay | Attending: Oncology | Admitting: Oncology

## 2017-07-26 ENCOUNTER — Encounter: Payer: Self-pay | Admitting: Emergency Medicine

## 2017-07-26 ENCOUNTER — Emergency Department
Admission: EM | Admit: 2017-07-26 | Discharge: 2017-07-26 | Disposition: A | Discharge status: Home / Self Care | Payer: Self-pay | Attending: Emergency Medicine | Admitting: Emergency Medicine

## 2017-07-26 ENCOUNTER — Encounter: Payer: Self-pay | Admitting: Nurse Practitioner

## 2017-07-26 ENCOUNTER — Encounter: Payer: Self-pay | Admitting: Oncology

## 2017-07-26 ENCOUNTER — Other Ambulatory Visit: Payer: Self-pay

## 2017-07-26 ENCOUNTER — Inpatient Hospital Stay: Payer: Self-pay

## 2017-07-26 VITALS — BP 107/74 | HR 137 | Temp 101.2°F | Resp 20 | Wt 129.1 lb

## 2017-07-26 DIAGNOSIS — Z79899 Other long term (current) drug therapy: Secondary | ICD-10-CM | POA: Insufficient documentation

## 2017-07-26 DIAGNOSIS — B20 Human immunodeficiency virus [HIV] disease: Secondary | ICD-10-CM | POA: Insufficient documentation

## 2017-07-26 DIAGNOSIS — D61818 Other pancytopenia: Secondary | ICD-10-CM | POA: Insufficient documentation

## 2017-07-26 DIAGNOSIS — Z8701 Personal history of pneumonia (recurrent): Secondary | ICD-10-CM | POA: Insufficient documentation

## 2017-07-26 DIAGNOSIS — R634 Abnormal weight loss: Secondary | ICD-10-CM | POA: Insufficient documentation

## 2017-07-26 DIAGNOSIS — B59 Pneumocystosis: Secondary | ICD-10-CM | POA: Insufficient documentation

## 2017-07-26 DIAGNOSIS — E860 Dehydration: Secondary | ICD-10-CM | POA: Insufficient documentation

## 2017-07-26 DIAGNOSIS — R5383 Other fatigue: Secondary | ICD-10-CM | POA: Insufficient documentation

## 2017-07-26 DIAGNOSIS — R5381 Other malaise: Secondary | ICD-10-CM | POA: Insufficient documentation

## 2017-07-26 DIAGNOSIS — R509 Fever, unspecified: Secondary | ICD-10-CM

## 2017-07-26 DIAGNOSIS — R61 Generalized hyperhidrosis: Secondary | ICD-10-CM | POA: Insufficient documentation

## 2017-07-26 DIAGNOSIS — B159 Hepatitis A without hepatic coma: Secondary | ICD-10-CM | POA: Insufficient documentation

## 2017-07-26 DIAGNOSIS — D649 Anemia, unspecified: Secondary | ICD-10-CM

## 2017-07-26 HISTORY — DX: Hepatitis a without hepatic coma: B15.9

## 2017-07-26 HISTORY — DX: Human immunodeficiency virus (HIV) disease: B20

## 2017-07-26 LAB — CBC WITH DIFFERENTIAL/PLATELET
BASOS PCT: 0 %
Basophils Absolute: 0 10*3/uL (ref 0–0.1)
Basophils Absolute: 0 10*3/uL (ref 0–0.1)
Basophils Relative: 0 %
EOS ABS: 0 10*3/uL (ref 0–0.7)
Eosinophils Absolute: 0 10*3/uL (ref 0–0.7)
Eosinophils Relative: 0 %
Eosinophils Relative: 0 %
HEMATOCRIT: 30.3 % — AB (ref 40.0–52.0)
HEMATOCRIT: 31.9 % — AB (ref 40.0–52.0)
HEMOGLOBIN: 10.1 g/dL — AB (ref 13.0–18.0)
Hemoglobin: 10.6 g/dL — ABNORMAL LOW (ref 13.0–18.0)
LYMPHS ABS: 0.9 10*3/uL — AB (ref 1.0–3.6)
LYMPHS PCT: 31 %
Lymphocytes Relative: 29 %
Lymphs Abs: 0.9 10*3/uL — ABNORMAL LOW (ref 1.0–3.6)
MCH: 26.7 pg (ref 26.0–34.0)
MCH: 26.5 pg (ref 26.0–34.0)
MCHC: 33.2 g/dL (ref 32.0–36.0)
MCHC: 33.1 g/dL (ref 32.0–36.0)
MCV: 80.4 fL (ref 80.0–100.0)
MCV: 80 fL (ref 80.0–100.0)
MONO ABS: 0.3 10*3/uL (ref 0.2–1.0)
MONO ABS: 0.4 10*3/uL (ref 0.2–1.0)
MONOS PCT: 10 %
MONOS PCT: 12 %
NEUTROS ABS: 1.8 10*3/uL (ref 1.4–6.5)
NEUTROS PCT: 59 %
Neutro Abs: 1.8 10*3/uL (ref 1.4–6.5)
Neutrophils Relative %: 59 %
Platelets: 216 10*3/uL (ref 150–440)
Platelets: 233 10*3/uL (ref 150–440)
RBC: 3.77 MIL/uL — ABNORMAL LOW (ref 4.40–5.90)
RBC: 3.99 MIL/uL — ABNORMAL LOW (ref 4.40–5.90)
RDW: 18.3 % — AB (ref 11.5–14.5)
RDW: 18.6 % — AB (ref 11.5–14.5)
WBC: 3 10*3/uL — ABNORMAL LOW (ref 3.8–10.6)
WBC: 3 10*3/uL — ABNORMAL LOW (ref 3.8–10.6)

## 2017-07-26 LAB — COMPREHENSIVE METABOLIC PANEL
ALK PHOS: 58 U/L (ref 38–126)
ALT: 40 U/L (ref 17–63)
AST: 79 U/L — AB (ref 15–41)
Albumin: 3.6 g/dL (ref 3.5–5.0)
Anion gap: 10 (ref 5–15)
BILIRUBIN TOTAL: 0.6 mg/dL (ref 0.3–1.2)
BUN: 11 mg/dL (ref 6–20)
CALCIUM: 8.9 mg/dL (ref 8.9–10.3)
CO2: 27 mmol/L (ref 22–32)
CREATININE: 0.95 mg/dL (ref 0.61–1.24)
Chloride: 98 mmol/L — ABNORMAL LOW (ref 101–111)
GFR calc Af Amer: >60 mL/min (ref >60)
Glucose, Bld: 98 mg/dL (ref 65–99)
POTASSIUM: 4.1 mmol/L (ref 3.5–5.1)
Sodium: 135 mmol/L (ref 135–145)
TOTAL PROTEIN: 8 g/dL (ref 6.5–8.1)

## 2017-07-26 LAB — URINALYSIS, COMPLETE (UACMP) WITH MICROSCOPIC
BACTERIA UA: NONE SEEN
BILIRUBIN URINE: NEGATIVE
Glucose, UA: NEGATIVE mg/dL
Hgb urine dipstick: NEGATIVE
KETONES UR: NEGATIVE mg/dL
LEUKOCYTES UA: NEGATIVE
Nitrite: NEGATIVE
PH: 6 (ref 5.0–8.0)
Protein, ur: 30 mg/dL — AB
SQUAMOUS EPITHELIAL / LPF: NONE SEEN
Specific Gravity, Urine: 1.017 (ref 1.005–1.030)

## 2017-07-26 LAB — LACTIC ACID, PLASMA
Lactic Acid, Venous: 2 mmol/L (ref 0.5–1.9)
Lactic Acid, Venous: 1 mmol/L (ref 0.5–1.9)

## 2017-07-26 LAB — PROTIME-INR
INR: 1.01
PROTHROMBIN TIME: 13.3 s (ref 11.4–15.2)

## 2017-07-26 MED ORDER — ONDANSETRON 4 MG PO TBDP: 4.0000 mg | ORAL_TABLET | Freq: Three times a day (TID) | ORAL | 0 refills | Status: AC | PRN

## 2017-07-26 MED ORDER — SODIUM CHLORIDE 0.9 % IV BOLUS (SEPSIS)
2000.0000 mL | Freq: Once | INTRAVENOUS | Status: AC
Start: 2017-07-26 — End: 2017-07-26
  Administered 2017-07-26: 2000 mL via INTRAVENOUS

## 2017-07-26 MED ORDER — KETOROLAC TROMETHAMINE 30 MG/ML IJ SOLN
15.0000 mg | INTRAMUSCULAR | Status: AC
  Administered 2017-07-26: 15 mg via INTRAVENOUS
  Filled 2017-07-26: qty 1

## 2017-07-26 NOTE — ED Provider Notes (Signed)
[facility] Emergency Department Provider Note  ____________________________________________  Time seen: Approximately 6:33 PM  I have reviewed the triage vital signs and the nursing notes.   HISTORY  Chief Complaint Fever    HPI Jeremiah Reed is a 26 y.o. male who [facility] for a hospital follow-up visit with noticed that he had a fever of 101 and was tachycardic and sent him to the ED for evaluation.He was in the hospital for the past week due to bilateral pneumonia which is turned out to be PCP pneumonia. Patient has HIV with CD4 count of 45. Also has hepatitis A. He has been taking Levaquin nystatin and Bactrim. He reports it is actually overall feeling better. Still has fatigue, still has a nonproductive cough, but not short of breath, no chest pain, no sweats or chills. Eating and drinking normally. Denies any pain anywhere else. No back pain or neck pain. No headache or vision changes. No skin rashes or lesions.     Past Medical History:  Diagnosis Date  . Anxiety   . Depression   . GERD (gastroesophageal reflux disease)   . Heart murmur   . Hepatitis A   . HIV (human immunodeficiency virus infection) [facility](HCC)      Patient Active Problem List   Diagnosis Date Noted  . HIV (human immunodeficiency virus infection) (HCC) 07/26/2017  . Sepsis (HCC) 07/16/2017     Past Surgical History:  Procedure Laterality Date  . APPENDECTOMY       Prior to Admission medications   Medication Sig Start Date End Date Taking? Authorizing Provider  ferrous sulfate 325 (65 FE) MG tablet Take 1 tablet (325 mg total) by mouth 2 (two) times daily with a meal. 07/20/17  Yes Katha HammingKonidena, Snehalatha, MD  nystatin (MYCOSTATIN) 100000 UNIT/ML suspension Take 5 mLs (500,000 Units total) by mouth 4 (four) times daily. 07/20/17  Yes Katha HammingKonidena, Snehalatha, MD  senna-docusate (SENOKOT-S) 8.6-50 MG tablet Take 2 tablets by mouth 2 (two) times daily. 06/05/17  Yes  Sharman CheekStafford, Trayden Brandy, MD  sulfamethoxazole-trimethoprim (BACTRIM,SEPTRA) 400-80 MG tablet Take 1 tablet by mouth 2 (two) times daily. 07/20/17  Yes Katha HammingKonidena, Snehalatha, MD  VENTOLIN HFA 108 (90 Base) MCG/ACT inhaler Inhale 2 puffs into the lungs every 4 (four) hours as needed. 06/15/17  Yes [provider]  famotidine (PEPCID) 20 MG tablet Take 1 tablet (20 mg total) by mouth daily. Patient not taking: Reported on 07/26/2017 05/08/17 05/08/18  Rebecka ApleyWebster, Allison P, MD  ondansetron (ZOFRAN ODT) 4 MG disintegrating tablet Take 1 tablet (4 mg total) by mouth every 8 (eight) hours as needed for nausea or vomiting. 07/26/17   Sharman CheekStafford, Chez Bulnes, MD     Allergies Patient has no known allergies.   No family history on file.  Social History Social History  Substance Use Topics  . Smoking status: Never Smoker  . Smokeless tobacco: Never Used  . Alcohol use No    Review of Systems  Constitutional:   Positive fever.  ENT:   No sore throat. No rhinorrhea. Cardiovascular:   No chest pain or syncope. Positive fast heart rate Respiratory:   No dyspnea or cough. Gastrointestinal:   Negative for abdominal pain, vomiting and diarrhea.  Musculoskeletal:   Negative for focal pain or swelling All other systems reviewed and are negative except as documented above in ROS and HPI.  ____________________________________________   PHYSICAL EXAM:  VITAL SIGNS: ED Triage Vitals  Enc Vitals Group     BP 07/26/17 1431 115/74  Pulse Rate 07/26/17 1431 (!) 133     Resp 07/26/17 1431 16     Temp 07/26/17 1431 (!) 101.4 F (38.6 C)     Temp Source 07/26/17 1431 Oral     SpO2 07/26/17 1431 100 %     Weight 07/26/17 1433 129 lb (58.5 kg)     Height 07/26/17 1433 5\' 7"  (1.702 m)     Head Circumference --      Peak Flow --      Pain Score 07/26/17 1522 0     Pain Loc --      Pain Edu? --      Excl. in GC? --     Vital signs reviewed, nursing assessments reviewed.   Constitutional:   Alert and  oriented. Well appearing and in no distress. Eyes:   No scleral icterus.  EOMI. No nystagmus. No conjunctival pallor. PERRL. ENT   Head:   Normocephalic and atraumatic.   Nose:   No congestion/rhinnorhea.    Mouth/Throat:   MMM, no pharyngeal erythema. No peritonsillar mass.    Neck:   No meningismus. Full ROM Hematological/Lymphatic/Immunilogical:   No cervical lymphadenopathy. Cardiovascular:   Tachycardia heart rate 1:15. Symmetric bilateral radial and DP pulses.  No murmurs.  Respiratory:   Normal respiratory effort without tachypnea/retractions. Diminished breath sounds bilateral bases. Gastrointestinal:   Soft and nontender. Non distended. There is no CVA tenderness.  No rebound, rigidity, or guarding. Genitourinary:   deferred Musculoskeletal:   Normal range of motion in all extremities. No joint effusions.  No lower extremity tenderness.  No edema. Neurologic:   Normal speech and language.  Motor grossly intact. No gross focal neurologic deficits are appreciated.  Skin:    Skin is warm, dry and intact. No rash noted.  No petechiae, purpura, or bullae. No embolic phenomena  ____________________________________________    LABS (pertinent positives/negatives) (all labs ordered are listed, but only abnormal results are displayed) Labs Reviewed  COMPREHENSIVE METABOLIC PANEL - Abnormal; Notable for the following:       Result Value   Chloride 98 (*)    AST 79 (*)    All other components within normal limits  LACTIC ACID, PLASMA - Abnormal; Notable for the following:    Lactic Acid, Venous 2.0 (*)    All other components within normal limits  CBC WITH DIFFERENTIAL/PLATELET - Abnormal; Notable for the following:    WBC 3.0 (*)    RBC 3.99 (*)    Hemoglobin 10.6 (*)    HCT 31.9 (*)    RDW 18.6 (*)    Lymphs Abs 0.9 (*)    All other components within normal limits  URINALYSIS, COMPLETE (UACMP) WITH MICROSCOPIC - Abnormal; Notable for the following:    Color, Urine  YELLOW (*)    APPearance CLEAR (*)    Protein, ur 30 (*)    All other components within normal limits  CULTURE, BLOOD (ROUTINE X 2)  CULTURE, BLOOD (ROUTINE X 2)  LACTIC ACID, PLASMA  PROTIME-INR   ____________________________________________   EKG    ____________________________________________    RADIOLOGY  Dg Chest 2 View  Result Date: 07/26/2017 CLINICAL DATA:  Fever and tachycardia.  HIV positive. EXAM: CHEST  2 VIEW COMPARISON:  07/19/2017 and 07/16/2017 FINDINGS: The heart size and mediastinal contours are within normal limits. Mild bilateral lower lobe infiltrates show slight improvement since previous studies. No new or worsening areas of pulmonary opacity are seen. No evidence of pleural effusion. IMPRESSION: Mild improvement in bilateral  lower lobe infiltrates compared to recent studies. No new or progressive disease identified. Electronically Signed   By: Myles Rosenthal M.D.   On: 07/26/2017 15:28    ____________________________________________   PROCEDURES Procedures  ____________________________________________   INITIAL IMPRESSION / ASSESSMENT AND PLAN / ED COURSE  Pertinent labs & imaging results that were available during my care of the patient were reviewed by me and considered in my medical decision making (see chart for details).  Patient sent to ED for evaluation due to tachycardia and fever. He is overall feeling better. Chest x-ray shows radiographic improvement. Blood pressure is unremarkable, oxygen saturation is normal. I discussed with Dr. Sampson Goon of ID who agrees that this is the expected course and that the patient is likely to remain febrile for another week or so, at least until he starts antiretroviral therapy. He has an appointment with the HIV clinic in 4 days. He is given contact information for the clinic to ensure that he can follow-up. He is well-appearing nontoxic, not septic. Low suspicion for meningitis encephalitis or intra-abdominal  pathology weight is tolerating oral intake, smiling and energetic. On reassessment at 6:30 PM he is feeling better, tachycardia has resolved, afebrile, normal blood pressure. Suitable for discharge home. Return precautions given.      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     Portions of this note were generated with dragon dictation software. Dictation errors may occur despite best attempts at proofreading.    Sharman Cheek, MD 07/26/17 [phone]

## 2017-07-26 NOTE — ED Notes (Signed)
Date and time results received: 07/26/17 1526 (use smartphrase ".now" to insert current time)  Test: Lactic Acid Critical Value: 2.0  Name of Provider Notified: Dr. Scotty CourtStafford  Orders Received? Or Actions Taken?:

## 2017-07-26 NOTE — Progress Notes (Signed)
Hematology/Oncology Consult note [facility]  [phone] [phone]  Patient Care Team: Patient, No Pcp Per as PCP - General (General Practice)   Name of the patient: Jeremiah Reed  [id number]  August 15, 1991   Date of visit: 07/26/17  Diagnosis- anemia/ leukpenia secondary to acute HIV infection  Chief complaint/ Reason for visit- post hospital discharge f/u  Heme/Onc history: patient is a 26 year old male who was admitted to the hospital with 4 week history of shortness of breath cough and fever with chills. He was treated with azithromycin a couple of months ago. On admission patient was found to have a white count of 3. CT scan of the chest showed multifocal pneumonia. No evidence of mediastinal hilar or axillary adenopathy. He was tested positive for HIV. He has already been seen by infectious disease who plans to start him on antiretroviral therapy soon after his acute episode of pneumonia improves He does have a history of ongoing night sweats and 20 pound weight loss. He is bisexual but does not know if any of his sex partners have HIV. We have been consult for his anemia.  Today's white count is 1.5, H&H 7.8/23.6 and a platelet count of 214. On 07/16/2017 his white count was 3.6 and on 07/17/2017 his white count was 4.3. Patient had a white count of 1.9 in May 2018 which eventually recovered between 3-4 the following couple of months. Ferritin levels were elevated at 2305. Iron studies showed low iron saturation of 7%. B12 was normal and Folate was normal at 22. Hepatitis A antibody positive. No evidence of hep B infection. Testing for hep C was negative. Reticulocyte count low at 0.7% indicative of hypoproliferative anemia  Results of bloodwork from 07/19/2017 were as follows: Peripheral flow cytometry showed no significant immunophenotyping abnormality. Haptoglobin and serum copper was within normal limits. Parvovirus B19 PCR was negative.     Interval history- he will be going to HIv clinic next week. He has been feeling warm and fatigued since last few days. Has not checked his temperature at home. He completed levaquin and is still on bactrim  ECOG PS- 1 Pain scale- 0   Review of systems- Review of Systems  Constitutional: Positive for malaise/fatigue. Negative for chills, fever and weight loss.  HENT: Negative for congestion, ear discharge and nosebleeds.   Eyes: Negative for blurred vision.  Respiratory: Negative for cough, hemoptysis, sputum production, shortness of breath and wheezing.   Cardiovascular: Negative for chest pain, palpitations, orthopnea and claudication.  Gastrointestinal: Negative for abdominal pain, blood in stool, constipation, diarrhea, heartburn, melena, nausea and vomiting.  Genitourinary: Negative for dysuria, flank pain, frequency, hematuria and urgency.  Musculoskeletal: Negative for back pain, joint pain and myalgias.  Skin: Negative for rash.  Neurological: Negative for dizziness, tingling, focal weakness, seizures, weakness and headaches.  Endo/Heme/Allergies: Does not bruise/bleed easily.  Psychiatric/Behavioral: Negative for depression and suicidal ideas. The patient does not have insomnia.       No Known Allergies   Past Medical History:  Diagnosis Date  . Heart murmur      Past Surgical History:  Procedure Laterality Date  . APPENDECTOMY      Social History   Social History  . Marital status: Single    Spouse name: N/A  . Number of children: N/A  . Years of education: N/A   Occupational History  . Not on file.   Social History Main Topics  . Smoking status: Never Smoker  . Smokeless  tobacco: Never Used  . Alcohol use Yes  . Drug use: No  . Sexual activity: Not on file   Other Topics Concern  . Not on file   Social History Narrative  . No narrative on file    No family history on file.   Current Outpatient Prescriptions:  .  famotidine (PEPCID) 20 MG  tablet, Take 1 tablet (20 mg total) by mouth daily. (Patient not taking: Reported on 06/05/2017), Disp: 20 tablet, Rfl: 0 .  ferrous sulfate 325 (65 FE) MG tablet, Take 1 tablet (325 mg total) by mouth 2 (two) times daily with a meal., Disp: 30 tablet, Rfl: 3 .  levofloxacin (LEVAQUIN) 750 MG tablet, Take 1 tablet (750 mg total) by mouth daily., Disp: 5 tablet, Rfl: 0 .  nystatin (MYCOSTATIN) 100000 UNIT/ML suspension, Take 5 mLs (500,000 Units total) by mouth 4 (four) times daily., Disp: 200 mL, Rfl: 0 .  senna-docusate (SENOKOT-S) 8.6-50 MG tablet, Take 2 tablets by mouth 2 (two) times daily., Disp: 120 tablet, Rfl: 0 .  sulfamethoxazole-trimethoprim (BACTRIM,SEPTRA) 400-80 MG tablet, Take 1 tablet by mouth 2 (two) times daily., Disp: 42 tablet, Rfl: 0 .  VENTOLIN HFA 108 (90 Base) MCG/ACT inhaler, Inhale 2 puffs into the lungs every 4 (four) hours as needed., Disp: , Rfl: 1  Physical exam:  Vitals:   07/26/17 1409 07/26/17 1411  BP: 107/74   Pulse: (!) 137   Resp: 20   Temp: (!) 101.2 F (38.4 C)   TempSrc: Tympanic   SpO2:  99%  Weight: 129 lb 1.6 oz (58.6 kg)    Physical Exam  Constitutional: He is oriented to person, place, and time.  Thin young gentleman who appears fatigued  HENT:  Head: Normocephalic and atraumatic.  Eyes: Pupils are equal, round, and reactive to light. EOM are normal.  Neck: Normal range of motion.  Cardiovascular: Regular rhythm and normal heart sounds.   Tachycardic. Rate regular  Pulmonary/Chest: Effort normal and breath sounds normal.  Abdominal: Soft. Bowel sounds are normal.  Neurological: He is alert and oriented to person, place, and time.  Skin: Skin is warm and dry.  No rash     CMP Latest Ref Rng & Units 07/20/2017  Glucose 65 - 99 mg/dL 82  BUN 6 - 20 mg/dL <5(L)  Creatinine 0.61 - 1.24 mg/dL 0.89  Sodium 135 - 145 mmol/L 137  Potassium 3.5 - 5.1 mmol/L 3.5  Chloride 101 - 111 mmol/L 106  CO2 22 - 32 mmol/L 24  Calcium 8.9 - 10.3 mg/dL  8.6(L)  Total Protein 6.5 - 8.1 g/dL -  Total Bilirubin 0.3 - 1.2 mg/dL -  Alkaline Phos 38 - 126 U/L -  AST 15 - 41 U/L -  ALT 17 - 63 U/L -   CBC Latest Ref Rng & Units 07/26/2017  WBC 3.8 - 10.6 K/uL 3.0(L)  Hemoglobin 13.0 - 18.0 g/dL 10.1(L)  Hematocrit 40.0 - 52.0 % 30.3(L)  Platelets 150 - 440 K/uL 216    No images are attached to the encounter.  Dg Chest 2 View  Result Date: 07/19/2017 CLINICAL DATA:  History of pneumonia.  Shortness of breath . EXAM: CHEST  2 VIEW COMPARISON:  CT chest x-ray 07/16/2017. FINDINGS: Mediastinum and hilar structures normal. Heart size stable. 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 No interim change. No pleural effusion or pneumothorax. No acute bony abnormality. IMPRESSION: Persistent multifocal multinodular infiltrates. No change from prior exam. Electronically Signed   By: Marcello Moores  Register  On: 07/19/2017 08:18   Dg Chest 2 View  Result Date: 07/16/2017 CLINICAL DATA:  On and off right-sided chest pain. EXAM: CHEST  2 VIEW COMPARISON:  05/08/2017 FINDINGS: There are streaky basal opacities bilaterally. Bronchopneumonia or atelectasis can have this appearance. No edema, effusion, or pneumothorax. Normal heart size and mediastinal contours. IMPRESSION: Bilateral bronchopneumonia or atelectasis in the lower lungs. Electronically Signed   By: Monte Fantasia M.D.   On: 07/16/2017 08:09   Ct Angio Chest Pe W And/or Wo Contrast  Result Date: 07/16/2017 CLINICAL DATA:  Chest pain, elevated D-dimer. EXAM: CT ANGIOGRAPHY CHEST WITH CONTRAST TECHNIQUE: Multidetector CT imaging of the chest was performed using the standard protocol during bolus administration of intravenous contrast. Multiplanar CT image reconstructions and MIPs were obtained to evaluate the vascular anatomy. CONTRAST:  75 cc Isovue 370 IV COMPARISON:  Chest x-ray earlier today FINDINGS: Cardiovascular: No filling defects in the pulmonary arteries to suggest pulmonary emboli.  Heart is normal size. Aorta is normal caliber. Mediastinum/Nodes: No mediastinal, hilar, or axillary adenopathy. Trachea and esophagus are unremarkable. Lungs/Pleura: Nodular airspace disease in the right upper lobe, right middle lobe, lingula and left lower lobe most compatible with multifocal pneumonia. No effusions. Upper Abdomen: Imaging into the upper abdomen shows no acute findings. Musculoskeletal: Chest wall soft tissues are unremarkable. No acute bony abnormality. Review of the MIP images confirms the above findings. IMPRESSION: Multifocal nodular airspace disease most compatible with pneumonia. No evidence of pulmonary embolus. Electronically Signed   By: Rolm Baptise M.D.   On: 07/16/2017 11:16   US Abdomen Limited Ruq  Result Date: 07/16/2017 CLINICAL DATA:  Right quadrant pain. EXAM: ULTRASOUND ABDOMEN LIMITED RIGHT UPPER QUADRANT COMPARISON:  06/05/2017. FINDINGS: Gallbladder: No gallstones or wall thickening visualized. No sonographic Murphy sign noted by sonographer. Common bile duct: Diameter: 2.6 mm Liver: No focal lesion identified. Within normal limits in parenchymal echogenicity. IMPRESSION: No acute or focal abnormality identified. Electronically Signed   By: Marcello Moores  Register   On: 07/16/2017 09:21     Assessment and plan- Patient is a 26 y.o. male who seen in the hospital for acute leukopenia (neutropenia and lymphopenia) and anemia likely secondary to newly diagnosed acute HIV infection and pneumonia (possibly pcp)  Wbc has improved. He is no longer neutropenia. Lymphopenia in the setting of immunosuppresion from HIV. Anemia improved. His counts are likely to improve further after HAART is initiated. No need for bone marrow biopsy at this time. I will see him back in 2 months with cbc/diff  Fever/ tachycardia- he will be going to ER for further evaluation    Visit Diagnosis 1. Other pancytopenia ([place])   2. HIV (human immunodeficiency virus infection) ([place])      Dr.  Randa Evens, MD, MPH [facility] Pager- [phone] 07/26/2017 1:39 PM

## 2017-07-26 NOTE — Discharge Instructions (Signed)
If the clinic hasn't called to make an appointment by Monday, call them at [phone], ask for Laurette SchimkeJulie Willis.  You should see Dr. Sampson GoonFitzgerald within 1 week. Continue taking the bactrim every day, and take ibuprofen for pain and fever control.  Use nausea medicine as needed.

## 2017-07-26 NOTE — Progress Notes (Signed)
Patient here today for hospital follow up visit, reports he is still having chest pain.

## 2017-07-26 NOTE — ED Notes (Signed)
[facility]. They said he is tachy and has a fever.

## 2017-07-26 NOTE — ED Triage Notes (Signed)
Patient presents from the cancer center today with c/o fever.

## 2017-07-28 LAB — REFLEX TO GENOSURE(R) MG

## 2017-07-29 LAB — HIV-1 RNA ULTRAQUANT REFLEX TO GENTYP+
HIV-1 RNA BY PCR: 8810000 copies/mL
HIV-1 RNA Quant, Log: 6.945 log10copy/mL

## 2017-07-31 ENCOUNTER — Emergency Department: Payer: Self-pay

## 2017-07-31 ENCOUNTER — Inpatient Hospital Stay
Admission: EM | Admit: 2017-07-31 | Discharge: 2017-08-09 | DRG: 974 | Disposition: A | Discharge status: Home / Self Care | Payer: Self-pay | Attending: Internal Medicine | Admitting: Internal Medicine

## 2017-07-31 ENCOUNTER — Encounter: Payer: Self-pay | Admitting: *Deleted

## 2017-07-31 DIAGNOSIS — R159 Full incontinence of feces: Secondary | ICD-10-CM | POA: Diagnosis present

## 2017-07-31 DIAGNOSIS — B159 Hepatitis A without hepatic coma: Secondary | ICD-10-CM | POA: Diagnosis present

## 2017-07-31 DIAGNOSIS — Z79899 Other long term (current) drug therapy: Secondary | ICD-10-CM

## 2017-07-31 DIAGNOSIS — D72819 Decreased white blood cell count, unspecified: Secondary | ICD-10-CM

## 2017-07-31 DIAGNOSIS — A071 Giardiasis [lambliasis]: Secondary | ICD-10-CM | POA: Diagnosis present

## 2017-07-31 DIAGNOSIS — F329 Major depressive disorder, single episode, unspecified: Secondary | ICD-10-CM | POA: Diagnosis present

## 2017-07-31 DIAGNOSIS — E43 Unspecified severe protein-calorie malnutrition: Secondary | ICD-10-CM | POA: Diagnosis present

## 2017-07-31 DIAGNOSIS — H36 Retinal disorders in diseases classified elsewhere: Secondary | ICD-10-CM | POA: Diagnosis present

## 2017-07-31 DIAGNOSIS — F419 Anxiety disorder, unspecified: Secondary | ICD-10-CM | POA: Diagnosis present

## 2017-07-31 DIAGNOSIS — N179 Acute kidney failure, unspecified: Secondary | ICD-10-CM | POA: Diagnosis present

## 2017-07-31 DIAGNOSIS — A4189 Other specified sepsis: Secondary | ICD-10-CM | POA: Diagnosis present

## 2017-07-31 DIAGNOSIS — B59 Pneumocystosis: Secondary | ICD-10-CM | POA: Diagnosis present

## 2017-07-31 DIAGNOSIS — B259 Cytomegaloviral disease, unspecified: Secondary | ICD-10-CM | POA: Diagnosis present

## 2017-07-31 DIAGNOSIS — B20 Human immunodeficiency virus [HIV] disease: Principal | ICD-10-CM | POA: Diagnosis present

## 2017-07-31 DIAGNOSIS — R011 Cardiac murmur, unspecified: Secondary | ICD-10-CM | POA: Diagnosis present

## 2017-07-31 DIAGNOSIS — J189 Pneumonia, unspecified organism: Secondary | ICD-10-CM | POA: Diagnosis present

## 2017-07-31 DIAGNOSIS — A419 Sepsis, unspecified organism: Secondary | ICD-10-CM

## 2017-07-31 DIAGNOSIS — Z6820 Body mass index (BMI) 20.0-20.9, adult: Secondary | ICD-10-CM

## 2017-07-31 DIAGNOSIS — K5900 Constipation, unspecified: Secondary | ICD-10-CM | POA: Diagnosis present

## 2017-07-31 DIAGNOSIS — K219 Gastro-esophageal reflux disease without esophagitis: Secondary | ICD-10-CM | POA: Diagnosis present

## 2017-07-31 DIAGNOSIS — D709 Neutropenia, unspecified: Secondary | ICD-10-CM

## 2017-07-31 LAB — CBC
HEMATOCRIT: 31.8 % — AB (ref 40.0–52.0)
HEMOGLOBIN: 10.4 g/dL — AB (ref 13.0–18.0)
MCH: 26.1 pg (ref 26.0–34.0)
MCHC: 32.6 g/dL (ref 32.0–36.0)
MCV: 80.1 fL (ref 80.0–100.0)
Platelets: 159 10*3/uL (ref 150–440)
RBC: 3.97 MIL/uL — ABNORMAL LOW (ref 4.40–5.90)
RDW: 18.5 % — ABNORMAL HIGH (ref 11.5–14.5)
WBC: 1.8 10*3/uL — ABNORMAL LOW (ref 3.8–10.6)

## 2017-07-31 LAB — COMPREHENSIVE METABOLIC PANEL
ALBUMIN: 3.6 g/dL (ref 3.5–5.0)
ALT: 90 U/L — ABNORMAL HIGH (ref 17–63)
ANION GAP: 10 (ref 5–15)
AST: 284 U/L — ABNORMAL HIGH (ref 15–41)
Alkaline Phosphatase: 82 U/L (ref 38–126)
BUN: 21 mg/dL — ABNORMAL HIGH (ref 6–20)
CALCIUM: 8.8 mg/dL — AB (ref 8.9–10.3)
CHLORIDE: 97 mmol/L — AB (ref 101–111)
CO2: 23 mmol/L (ref 22–32)
Creatinine, Ser: 1.56 mg/dL — ABNORMAL HIGH (ref 0.61–1.24)
GFR calc Af Amer: >60 mL/min (ref >60)
GFR calc non Af Amer: 60 mL/min — ABNORMAL LOW (ref >60)
GLUCOSE: 133 mg/dL — AB (ref 65–99)
POTASSIUM: 4.4 mmol/L (ref 3.5–5.1)
SODIUM: 130 mmol/L — AB (ref 135–145)
Total Bilirubin: 1 mg/dL (ref 0.3–1.2)
Total Protein: 7.8 g/dL (ref 6.5–8.1)

## 2017-07-31 LAB — CULTURE, BLOOD (ROUTINE X 2)
Culture: NO GROWTH
Culture: NO GROWTH
SPECIAL REQUESTS: ADEQUATE
Special Requests: ADEQUATE

## 2017-07-31 LAB — LACTIC ACID, PLASMA: LACTIC ACID, VENOUS: 1.6 mmol/L (ref 0.5–1.9)

## 2017-07-31 LAB — LIPASE, BLOOD: LIPASE: 52 U/L — AB (ref 11–51)

## 2017-07-31 MED ORDER — DEXTROSE 5 % IV SOLN
2.0000 g | Freq: Once | INTRAVENOUS | Status: AC
  Administered 2017-07-31: 2 g via INTRAVENOUS
  Filled 2017-07-31: qty 2

## 2017-07-31 MED ORDER — SODIUM CHLORIDE 0.9 % IV BOLUS (SEPSIS)
1000.0000 mL | Freq: Once | INTRAVENOUS | Status: AC
  Administered 2017-07-31: 1000 mL via INTRAVENOUS

## 2017-07-31 MED ORDER — VANCOMYCIN HCL IN DEXTROSE 1-5 GM/200ML-% IV SOLN
1000.0000 mg | Freq: Once | INTRAVENOUS | Status: AC
  Administered 2017-08-01: 1000 mg via INTRAVENOUS
  Filled 2017-07-31: qty 200

## 2017-07-31 NOTE — ED Triage Notes (Signed)
Pt reports he has been having multiple episodes of "gagging" over the past three days. Pt reports he has only had a couple episodes where bile came up in his throat but denies having vomited other than those events. Pt was dx with pneumonia last week and was given nausea medication that he reports is not helping at this time.  Pt reports his breathing has improved since last week but continues to reports having had SOB earlier today. Pt denies abd pain at this time and denies diarrhea and well as denies having fevers. Pt is currently an HIV pt.

## 2017-07-31 NOTE — ED Provider Notes (Signed)
[facility] Emergency Department Provider Note  ____________________________________________   First MD Initiated Contact with Patient 07/31/17 2105     (approximate)  I have reviewed the triage vital signs and the nursing notes.   HISTORY  Chief Complaint Emesis    HPI Jeremiah Reed is a 26 y.o. male recently admitted to the hospital for pneumonia that appears to be PCP pneumoniaand who recently received his HIV/AIDS diagnosis. He presents for evaluation of three days of persistent dry heaving and inability to tolerate fluids. He is still having fevers and chills and is currently having rigors in the ED bed.  he states he was taking his antibiotics but he is not feeling better and he has gotten worse over the last several days. His breathing has improved although he had some shortness of breath earlier today. He denies any abdominal pain and diarrhea associated with the nausea but states that that is the biggest issue. He feels generally tired and fatigued all over. Nothing makes his symptoms better or worse and they are currently severe. He has not yet had a follow-up appointment in the infectious disease clinic.   Past Medical History:  Diagnosis Date  . Anxiety   . Depression   . GERD (gastroesophageal reflux disease)   . Heart murmur   . Hepatitis A   . HIV (human immunodeficiency virus infection) [facility])     Patient Active Problem List   Diagnosis Date Noted  . HCAP (healthcare-associated pneumonia) 07/31/2017  . HIV (human immunodeficiency virus infection) (HCC) 07/26/2017  . Sepsis (HCC) 07/16/2017    Past Surgical History:  Procedure Laterality Date  . APPENDECTOMY      Prior to Admission medications   Medication Sig Start Date End Date Taking? Authorizing Provider  famotidine (PEPCID) 20 MG tablet Take 1 tablet (20 mg total) by mouth daily. Patient not taking: Reported on 07/26/2017 05/08/17 05/08/18  Rebecka Apley, MD  ferrous  sulfate 325 (65 FE) MG tablet Take 1 tablet (325 mg total) by mouth 2 (two) times daily with a meal. 07/20/17   Katha Hamming, MD  nystatin (MYCOSTATIN) 100000 UNIT/ML suspension Take 5 mLs (500,000 Units total) by mouth 4 (four) times daily. 07/20/17   Katha Hamming, MD  ondansetron (ZOFRAN ODT) 4 MG disintegrating tablet Take 1 tablet (4 mg total) by mouth every 8 (eight) hours as needed for nausea or vomiting. 07/26/17   Sharman Cheek, MD  senna-docusate (SENOKOT-S) 8.6-50 MG tablet Take 2 tablets by mouth 2 (two) times daily. 06/05/17   Sharman Cheek, MD  sulfamethoxazole-trimethoprim (BACTRIM,SEPTRA) 400-80 MG tablet Take 1 tablet by mouth 2 (two) times daily. 07/20/17   Katha Hamming, MD  VENTOLIN HFA 108 (90 Base) MCG/ACT inhaler Inhale 2 puffs into the lungs every 4 (four) hours as needed. 06/15/17   [provider]    Allergies Patient has no known allergies.  History reviewed. No pertinent family history.  Social History Social History  Substance Use Topics  . Smoking status: Never Smoker  . Smokeless tobacco: Never Used  . Alcohol use No    Review of Systems Constitutional: +fever/chills Eyes: No visual changes. ENT: No sore throat. Cardiovascular: Denies chest pain. Respiratory: Improving but still occasional shortness of breath. Gastrointestinal: No abdominal pain.  +N/V x 3 days, unrelenting, unable to eat/drink.  No diarrhea.  No constipation. Genitourinary: Negative for dysuria. Musculoskeletal: Negative for neck pain.  Negative for back pain. Integumentary: Negative for rash. Neurological: Negative for headaches, focal weakness or numbness.  ____________________________________________   PHYSICAL EXAM:  VITAL SIGNS: ED Triage Vitals [07/31/17 1805]  Enc Vitals Group     BP 102/74     Pulse Rate (!) 127     Resp 20     Temp (!) 100.9 F (38.3 C)     Temp Source Oral     SpO2 100 %     Weight 58.5 kg (129 lb)     Height  1.702 m (5\' 7" )     Head Circumference      Peak Flow      Pain Score      Pain Loc      Pain Edu?      Excl. in GC?     Constitutional: Alert and oriented. Ill-appearing, having rigors in bed, but alert and oriented Eyes: Conjunctivae are normal.  Head: Atraumatic. Nose: No congestion/rhinnorhea. Mouth/Throat: Mucous membranes are moist. Neck: No stridor.  No meningeal signs, able to move his head and neck and look all around without difficulty Cardiovascular: Tachycardia, regular rhythm. Good peripheral circulation. Grossly normal heart sounds. Respiratory: Normal respiratory effort.  No retractions. Lungs CTAB. Gastrointestinal: Soft and nontender. No distention.  Musculoskeletal: No lower extremity tenderness nor edema. No gross deformities of extremities. Neurologic:  Normal speech and language. No gross focal neurologic deficits are appreciated.  Skin:  Skin is warm, dry and intact. No rash noted. Psychiatric: Mood and affect are normal. Speech and behavior are normal.  ____________________________________________   LABS (all labs ordered are listed, but only abnormal results are displayed)  Labs Reviewed  LIPASE, BLOOD - Abnormal; Notable for the following:       Result Value   Lipase 52 (*)    All other components within normal limits  COMPREHENSIVE METABOLIC PANEL - Abnormal; Notable for the following:    Sodium 130 (*)    Chloride 97 (*)    Glucose, Bld 133 (*)    BUN 21 (*)    Creatinine, Ser 1.56 (*)    Calcium 8.8 (*)    AST 284 (*)    ALT 90 (*)    GFR calc non Af Amer 60 (*)    All other components within normal limits  CBC - Abnormal; Notable for the following:    WBC 1.8 (*)    RBC 3.97 (*)    Hemoglobin 10.4 (*)    HCT 31.8 (*)    RDW 18.5 (*)    All other components within normal limits  CULTURE, BLOOD (ROUTINE X 2)  CULTURE, BLOOD (ROUTINE X 2)  CULTURE, EXPECTORATED SPUTUM-ASSESSMENT  ACID FAST SMEAR (AFB)  ACID FAST CULTURE WITH REFLEXED  SENSITIVITIES  LACTIC ACID, PLASMA  URINALYSIS, COMPLETE (UACMP) WITH MICROSCOPIC  CBC  COMPREHENSIVE METABOLIC PANEL   ____________________________________________  EKG  EKG not ordered by ED physician ____________________________________________  RADIOLOGY   Dg Abdomen Acute W/chest  Result Date: 07/31/2017 CLINICAL DATA:  Recent gagging episodes EXAM: DG ABDOMEN ACUTE W/ 1V CHEST COMPARISON:  07/26/2017 FINDINGS: Cardiac shadow is stable. Previously seen basilar infiltrates have resolved in the interval. No new focal infiltrate is seen. Scattered large and small bowel gas is noted. No obstructive changes are seen. No abnormal mass or abnormal calcifications are seen. No acute bony abnormality is noted. IMPRESSION: No acute abnormality seen. Electronically Signed   By: Alcide CleverMark  Lukens M.D.   On: 07/31/2017 21:53    ____________________________________________   PROCEDURES  Critical Care performed: Yes, see critical care procedure note(s)   Procedure(s) performed:   .  Critical Care Performed by: Loleta Rose Authorized by: Loleta Rose   Critical care provider statement:    Critical care time (minutes):  30   Critical care time was exclusive of:  Separately billable procedures and treating other patients   Critical care was necessary to treat or prevent imminent or life-threatening deterioration of the following conditions:  Sepsis   Critical care was time spent personally by me on the following activities:  Development of treatment plan with patient or surrogate, discussions with consultants, evaluation of patient's response to treatment, examination of patient, obtaining history from patient or surrogate, ordering and performing treatments and interventions, ordering and review of laboratory studies, ordering and review of radiographic studies, pulse oximetry, re-evaluation of patient's condition and review of old  charts     ____________________________________________   INITIAL IMPRESSION / ASSESSMENT AND PLAN / ED COURSE  Pertinent labs & imaging results that were available during my care of the patient were reviewed by me and considered in my medical decision making (see chart for details).  I am making the patient a code sepsis. Initially I thought that his tachycardia was secondary to the persistent nausea and vomiting and volume depletion, but see clinical course and notes for additional details   Clinical Course as of Aug 02 247  Wed Jul 31, 2017  2303 I reviewed the EMR and see that the patient has been seen several times recently was treated for PCP pneumonia in the setting of recently diagnosed HIV.  His x-ray looks better now but he is more leukopenic and he is obviously dehydrated due to his persistent vomiting and inability to tolerate oral intake.  His creatinine is elevated, his lipase is slightly elevated, he is leukopenic, and he looks ill if not toxic.  I am providing 30 mL/kg of IV fluids.  Given that his kidneys are not functioning at a normal level, I will give cefepime and vancomycin rather than Zosyn and order to not worsen his acute kidney injury.  He continues to have rigors and be febrile.  I am sending cultures and will admit to the hospitalist.  Of note, I tried contacting Dr. Sampson Goon with infectious disease but he is apparently not covering call tonight and the answering service has not tracked down anyone else who can cover.  [CF]  2337 Spoke with Dr. Emmit Pomfret who will admit  [CF]    Clinical Course User Index [CF] Loleta Rose, MD    ____________________________________________  FINAL CLINICAL IMPRESSION(S) / ED DIAGNOSES  Final diagnoses:  Sepsis, due to unspecified organism (HCC)  Leukopenia, unspecified type  HIV (human immunodeficiency virus infection) (HCC)     MEDICATIONS GIVEN DURING THIS VISIT:  Medications  ferrous sulfate tablet 325 mg  (not administered)  nystatin (MYCOSTATIN) 100000 UNIT/ML suspension 500,000 Units (not administered)  sulfamethoxazole-trimethoprim (BACTRIM,SEPTRA) 400-80 MG per tablet 1 tablet (not administered)  0.9 %  sodium chloride infusion (not administered)  acetaminophen (TYLENOL) tablet 650 mg (650 mg Oral Given 08/01/17 0030)    Or  acetaminophen (TYLENOL) suppository 650 mg ( Rectal See Alternative 08/01/17 0030)  oxyCODONE (Oxy IR/ROXICODONE) immediate release tablet 5 mg (not administered)  zolpidem (AMBIEN) tablet 5 mg (not administered)  senna-docusate (Senokot-S) tablet 1 tablet (not administered)  bisacodyl (DULCOLAX) EC tablet 5 mg (not administered)  magnesium citrate solution 1 Bottle (not administered)  ondansetron (ZOFRAN) tablet 4 mg (not administered)    Or  ondansetron (ZOFRAN) injection 4 mg (not administered)  albuterol (PROVENTIL) (2.5 MG/3ML) 0.083% nebulizer solution  2.5 mg (not administered)  ipratropium (ATROVENT) nebulizer solution 0.5 mg (not administered)  vancomycin (VANCOCIN) IVPB 750 mg/150 ml premix (not administered)  ceFEPIme (MAXIPIME) 2 g in dextrose 5 % 50 mL IVPB (not administered)  azithromycin (ZITHROMAX) tablet 1,200 mg (not administered)  sodium chloride 0.9 % bolus 1,000 mL (0 mLs Intravenous Stopped 07/31/17 2345)    And  sodium chloride 0.9 % bolus 1,000 mL (1,000 mLs Intravenous Transfusing/Transfer 08/01/17 0040)  ceFEPIme (MAXIPIME) 2 g in dextrose 5 % 50 mL IVPB (0 g Intravenous Stopped 08/01/17 0024)  vancomycin (VANCOCIN) IVPB 1000 mg/200 mL premix (1,000 mg Intravenous Transfusing/Transfer 08/01/17 0040)     NEW OUTPATIENT MEDICATIONS STARTED DURING THIS VISIT:  Current Discharge Medication List      Current Discharge Medication List      Current Discharge Medication List       Note:  This document was prepared using Dragon voice recognition software and may include unintentional dictation errors.    Loleta Rose, MD 08/01/17  [phone]

## 2017-08-01 ENCOUNTER — Inpatient Hospital Stay: Payer: Self-pay

## 2017-08-01 LAB — DIFFERENTIAL
BASOS ABS: 0 10*3/uL (ref 0–0.1)
Basophils Relative: 0 %
EOS PCT: 0 %
Eosinophils Absolute: 0 10*3/uL (ref 0–0.7)
LYMPHS ABS: 0.3 10*3/uL — AB (ref 1.0–3.6)
LYMPHS PCT: 24 %
MONOS PCT: 19 %
Monocytes Absolute: 0.2 10*3/uL (ref 0.2–1.0)
Neutro Abs: 0.7 10*3/uL — ABNORMAL LOW (ref 1.4–6.5)
Neutrophils Relative %: 57 %

## 2017-08-01 LAB — COMPREHENSIVE METABOLIC PANEL
ALBUMIN: 2.8 g/dL — AB (ref 3.5–5.0)
ALT: 74 U/L — AB (ref 17–63)
AST: 298 U/L — AB (ref 15–41)
Alkaline Phosphatase: 85 U/L (ref 38–126)
Anion gap: 4 — ABNORMAL LOW (ref 5–15)
BUN: 19 mg/dL (ref 6–20)
CHLORIDE: 106 mmol/L (ref 101–111)
CO2: 23 mmol/L (ref 22–32)
CREATININE: 1.06 mg/dL (ref 0.61–1.24)
Calcium: 7.6 mg/dL — ABNORMAL LOW (ref 8.9–10.3)
GFR calc non Af Amer: >60 mL/min (ref >60)
GLUCOSE: 97 mg/dL (ref 65–99)
Potassium: 4.5 mmol/L (ref 3.5–5.1)
SODIUM: 133 mmol/L — AB (ref 135–145)
Total Bilirubin: 0.8 mg/dL (ref 0.3–1.2)
Total Protein: 5.9 g/dL — ABNORMAL LOW (ref 6.5–8.1)

## 2017-08-01 LAB — URINALYSIS, COMPLETE (UACMP) WITH MICROSCOPIC
BACTERIA UA: NONE SEEN
BILIRUBIN URINE: NEGATIVE
Glucose, UA: NEGATIVE mg/dL
KETONES UR: 5 mg/dL — AB
LEUKOCYTES UA: NEGATIVE
Nitrite: NEGATIVE
Protein, ur: 100 mg/dL — AB
SPECIFIC GRAVITY, URINE: 1.024 (ref 1.005–1.030)
pH: 6 (ref 5.0–8.0)

## 2017-08-01 LAB — CBC
HCT: 24.4 % — ABNORMAL LOW (ref 40.0–52.0)
HEMOGLOBIN: 8.1 g/dL — AB (ref 13.0–18.0)
MCH: 26.6 pg (ref 26.0–34.0)
MCHC: 33.4 g/dL (ref 32.0–36.0)
MCV: 79.8 fL — ABNORMAL LOW (ref 80.0–100.0)
PLATELETS: 105 10*3/uL — AB (ref 150–440)
RBC: 3.06 MIL/uL — AB (ref 4.40–5.90)
RDW: 17.9 % — ABNORMAL HIGH (ref 11.5–14.5)
WBC: 1.3 10*3/uL — AB (ref 3.8–10.6)

## 2017-08-01 MED ORDER — SULFAMETHOXAZOLE-TRIMETHOPRIM 400-80 MG PO TABS
1.0000 | ORAL_TABLET | Freq: Two times a day (BID) | ORAL | Status: DC
  Administered 2017-08-01 (×2): 1 via ORAL
  Filled 2017-08-01 (×3): qty 1

## 2017-08-01 MED ORDER — SENNOSIDES-DOCUSATE SODIUM 8.6-50 MG PO TABS: 1.0000 | ORAL_TABLET | Freq: Every evening | ORAL | Status: DC | PRN

## 2017-08-01 MED ORDER — SODIUM CHLORIDE 0.9 % IV SOLN
INTRAVENOUS | Status: DC
  Administered 2017-08-01 – 2017-08-03 (×8): via INTRAVENOUS

## 2017-08-01 MED ORDER — SULFAMETHOXAZOLE-TRIMETHOPRIM 800-160 MG PO TABS: 2.0000 | ORAL_TABLET | Freq: Three times a day (TID) | ORAL | Status: DC

## 2017-08-01 MED ORDER — FERROUS SULFATE 325 (65 FE) MG PO TABS
ORAL_TABLET | ORAL | Status: AC
  Filled 2017-08-01: qty 1

## 2017-08-01 MED ORDER — ACETAMINOPHEN 650 MG RE SUPP: 650.0000 mg | Freq: Four times a day (QID) | RECTAL | Status: DC | PRN

## 2017-08-01 MED ORDER — BISACODYL 5 MG PO TBEC
5.0000 mg | DELAYED_RELEASE_TABLET | Freq: Every day | ORAL | Status: DC | PRN
  Administered 2017-08-07: 5 mg via ORAL
  Filled 2017-08-01: qty 1

## 2017-08-01 MED ORDER — ALBUTEROL SULFATE (2.5 MG/3ML) 0.083% IN NEBU: 2.5000 mg | INHALATION_SOLUTION | Freq: Four times a day (QID) | RESPIRATORY_TRACT | Status: DC | PRN

## 2017-08-01 MED ORDER — SULFAMETHOXAZOLE-TRIMETHOPRIM 800-160 MG PO TABS: 1.0000 | ORAL_TABLET | Freq: Every day | ORAL | Status: DC

## 2017-08-01 MED ORDER — AZITHROMYCIN 500 MG IV SOLR: 1000.0000 mg | INTRAVENOUS | Status: DC

## 2017-08-01 MED ORDER — FERROUS SULFATE 325 (65 FE) MG PO TABS
325.0000 mg | ORAL_TABLET | Freq: Two times a day (BID) | ORAL | Status: DC
  Administered 2017-08-01 – 2017-08-09 (×16): 325 mg via ORAL
  Filled 2017-08-01 (×16): qty 1

## 2017-08-01 MED ORDER — ONDANSETRON HCL 4 MG PO TABS: 4.0000 mg | ORAL_TABLET | Freq: Four times a day (QID) | ORAL | Status: DC | PRN

## 2017-08-01 MED ORDER — ZOLPIDEM TARTRATE 5 MG PO TABS: 5.0000 mg | ORAL_TABLET | Freq: Every evening | ORAL | Status: DC | PRN

## 2017-08-01 MED ORDER — MAGNESIUM CITRATE PO SOLN
1.0000 | Freq: Once | ORAL | Status: AC | PRN
  Administered 2017-08-07: 1 via ORAL
  Filled 2017-08-01 (×2): qty 296

## 2017-08-01 MED ORDER — NYSTATIN 100000 UNIT/ML MT SUSP
5.0000 mL | Freq: Four times a day (QID) | OROMUCOSAL | Status: DC
  Administered 2017-08-01 – 2017-08-09 (×32): 500000 [IU] via ORAL
  Filled 2017-08-01 (×29): qty 5

## 2017-08-01 MED ORDER — VANCOMYCIN HCL IN DEXTROSE 750-5 MG/150ML-% IV SOLN
750.0000 mg | Freq: Two times a day (BID) | INTRAVENOUS | Status: DC
  Administered 2017-08-01 – 2017-08-02 (×3): 750 mg via INTRAVENOUS
  Filled 2017-08-01 (×4): qty 150

## 2017-08-01 MED ORDER — DEXTROSE 5 % IV SOLN
2.0000 g | Freq: Two times a day (BID) | INTRAVENOUS | Status: DC
  Administered 2017-08-01 – 2017-08-02 (×3): 2 g via INTRAVENOUS
  Filled 2017-08-01 (×4): qty 2

## 2017-08-01 MED ORDER — AZITHROMYCIN 600 MG PO TABS
1200.0000 mg | ORAL_TABLET | ORAL | Status: DC
Start: 2017-08-01 — End: 2017-08-01
  Filled 2017-08-01: qty 2

## 2017-08-01 MED ORDER — IPRATROPIUM BROMIDE 0.02 % IN SOLN: 0.5000 mg | Freq: Four times a day (QID) | RESPIRATORY_TRACT | Status: DC | PRN

## 2017-08-01 MED ORDER — DEXTROSE 5 % IV SOLN: 500.0000 mg | INTRAVENOUS | Status: DC

## 2017-08-01 MED ORDER — ONDANSETRON HCL 4 MG/2ML IJ SOLN
4.0000 mg | Freq: Four times a day (QID) | INTRAMUSCULAR | Status: DC | PRN
  Administered 2017-08-03 – 2017-08-07 (×3): 4 mg via INTRAVENOUS
  Filled 2017-08-01 (×4): qty 2

## 2017-08-01 MED ORDER — ACETAMINOPHEN 325 MG PO TABS
650.0000 mg | ORAL_TABLET | Freq: Four times a day (QID) | ORAL | Status: DC | PRN
  Administered 2017-08-01 – 2017-08-04 (×6): 650 mg via ORAL
  Filled 2017-08-01 (×6): qty 2

## 2017-08-01 MED ORDER — SULFAMETHOXAZOLE-TRIMETHOPRIM 800-160 MG PO TABS
2.0000 | ORAL_TABLET | Freq: Three times a day (TID) | ORAL | Status: DC
  Administered 2017-08-01 – 2017-08-05 (×12): 2 via ORAL
  Filled 2017-08-01 (×14): qty 2

## 2017-08-01 MED ORDER — NYSTATIN 100000 UNIT/ML MT SUSP
OROMUCOSAL | Status: AC
  Administered 2017-08-01: 500000 [IU]
  Filled 2017-08-01: qty 5

## 2017-08-01 MED ORDER — OXYCODONE HCL 5 MG PO TABS: 5.0000 mg | ORAL_TABLET | ORAL | Status: DC | PRN

## 2017-08-01 MED ORDER — AZITHROMYCIN 200 MG/5ML PO SUSR
1200.0000 mg | ORAL | Status: DC
  Administered 2017-08-01: 1200 mg via ORAL
  Filled 2017-08-01 (×2): qty 30

## 2017-08-01 NOTE — Care Management (Addendum)
TC to [facility] ([phone]). Tiumeq is not a drug they supply. Applied for voucher for [employer].   Copay for Biktarvy one tab a day # 30.  BIN # [id number] PCN # [id number] Grp #  [id number]  Mbr ID # [id number]  Please advise if this drug is to be called in to pharmacy.[facility] provide [facility] drug. Please write hard copy of prescription.

## 2017-08-01 NOTE — Progress Notes (Signed)
[facility] [place] NAME: Jeremiah Reed    MR#:  [id number]  DATE OF BIRTH:  08/30/1991  SUBJECTIVE:  CHIEF COMPLAINT:   Chief Complaint  Patient presents with  . Emesis   Has nausea but no vomiting Cough. No SOB Afebrile today  REVIEW OF SYSTEMS:    Review of Systems  Constitutional: Positive for malaise/fatigue. Negative for chills and fever.  HENT: Negative for sore throat.   Eyes: Negative for blurred vision, double vision and pain.  Respiratory: Positive for cough. Negative for hemoptysis, shortness of breath and wheezing.   Cardiovascular: Negative for chest pain, palpitations, orthopnea and leg swelling.  Gastrointestinal: Positive for nausea. Negative for abdominal pain, constipation, diarrhea, heartburn and vomiting.  Genitourinary: Negative for dysuria and hematuria.  Musculoskeletal: Negative for back pain and joint pain.  Skin: Negative for rash.  Neurological: Positive for weakness. Negative for sensory change, speech change, focal weakness and headaches.  Endo/Heme/Allergies: Does not bruise/bleed easily.  Psychiatric/Behavioral: Negative for depression. The patient is not nervous/anxious.     DRUG ALLERGIES:  No Known Allergies  VITALS:  Blood pressure 116/63, pulse 94, temperature 100 F (37.8 C), temperature source Oral, resp. rate 19, height _0  (1.702 m), weight 59.1 kg (130 lb 6.4 oz), SpO2 100 %.  PHYSICAL EXAMINATION:   Physical Exam  GENERAL:  26 y.o.-year-old patient lying in the bed with no acute distress.  EYES: Pupils equal, round, reactive to light and accommodation. No scleral icterus. Extraocular muscles intact.  HEENT: Head atraumatic, normocephalic. Oropharynx and nasopharynx clear.  NECK:  Supple, no jugular venous distention. No thyroid enlargement, no tenderness.  LUNGS: Normal breath sounds bilaterally, no wheezing, rales, rhonchi. No use of accessory muscles of respiration.   CARDIOVASCULAR: S1, S2 normal. No murmurs, rubs, or gallops.  ABDOMEN: Soft, nontender, nondistended. Bowel sounds present. No organomegaly or mass.  EXTREMITIES: No cyanosis, clubbing or edema b/l.    NEUROLOGIC: Cranial nerves II through XII are intact. No focal Motor or sensory deficits b/l.   PSYCHIATRIC: The patient is alert and oriented x 3.  SKIN: No obvious rash, lesion, or ulcer.   LABORATORY PANEL:   CBC  Recent Labs Lab 08/01/17 0336  WBC 1.3*  HGB 8.1*  HCT 24.4*  PLT 105*   ------------------------------------------------------------------------------------------------------------------ Chemistries   Recent Labs Lab 08/01/17 0336  NA 133*  K 4.5  CL 106  CO2 23  GLUCOSE 97  BUN 19  CREATININE 1.06  CALCIUM 7.6*  AST 298*  ALT 74*  ALKPHOS 85  BILITOT 0.8   ------------------------------------------------------------------------------------------------------------------  Cardiac Enzymes No results for input(s): TROPONINI in the last 168 hours. ------------------------------------------------------------------------------------------------------------------  RADIOLOGY:  Dg Abdomen Acute W/chest  Result Date: 07/31/2017 CLINICAL DATA:  Recent gagging episodes EXAM: DG ABDOMEN ACUTE W/ 1V CHEST COMPARISON:  07/26/2017 FINDINGS: Cardiac shadow is stable. Previously seen basilar infiltrates have resolved in the interval. No new focal infiltrate is seen. Scattered large and small bowel gas is noted. No obstructive changes are seen. No abnormal mass or abnormal calcifications are seen. No acute bony abnormality is noted. IMPRESSION: No acute abnormality seen. Electronically Signed   By: Inez Catalina M.D.   On: 07/31/2017 21:53     ASSESSMENT AND PLAN:   This is a 26 y.o. male with a history of HIV/AIDS, anxiety/depression, GERD now being admitted with:  #. Sepsis likely Healthcare Associated Pneumonia - Admit to inpatient - IV Cefepime & Vancomycin,  continue Bactrim for PCP and  azithro for MAC - Duonebs - Follow up blood & sputum cultures Appreciate ID input. Discussed with Dr. Ola Spurr  #. 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  CODE STATUS: FULL CODE  DVT Prophylaxis: SCDs  TOTAL TIME TAKING CARE OF THIS PATIENT: 30 minutes.   POSSIBLE D/C IN 1-2 DAYS, DEPENDING ON CLINICAL CONDITION.  Hillary Bow R M.D on 08/01/2017 at 2:18 PM  Between 7am to 6pm - Pager - [phone]  After 6pm go to [email or web address] - password [email or web address] [facility]  Office  [phone]  CC: Primary care physician; Patient, No Pcp Per  Note: This dictation was prepared with Dragon dictation along with smaller phrase technology. Any transcriptional errors that result from this process are unintentional.

## 2017-08-01 NOTE — Consult Note (Signed)
[facility] Infectious Disease     Reason for Consult: PNA, HIV    Referring Physician: Claria Dice Date of Admission:  07/31/2017   Active Problems:   HCAP (healthcare-associated pneumonia)   HPI: Jeremiah Reed is a 26 y.o. male with new dx HIV, (CD4 43, VL 8 million) recently admitted with 3-4 weeks cough and SOB, fever, chills as well as R sided CP. He has been having night sweats and has a 20 # wt loss. On that admission CT showed bil infiltrates.  Sputum cx neg and only one AFB obtained but that was negative.  He was dced on oral bactrim for PCP. He was being set up to come to clinic to start ART however is now readmitted with nausea and dry heaves.  Febrile to 103. AST elevated.  Denies etoh use.   Denies SOB, has some mild cough.   Past Medical History:  Diagnosis Date  . Anxiety   . Depression   . GERD (gastroesophageal reflux disease)   . Heart murmur   . Hepatitis A   . HIV (human immunodeficiency virus infection) ([place])    Past Surgical History:  Procedure Laterality Date  . APPENDECTOMY     Social History  Substance Use Topics  . Smoking status: Never Smoker  . Smokeless tobacco: Never Used  . Alcohol use No   History reviewed. No pertinent family history.  Allergies: No Known Allergies  Current antibiotics: Antibiotics Given (last 72 hours)    Date/Time Action Medication Dose Rate   07/31/17 2340 New Bag/Given  [Pharmacy delayed]   ceFEPIme (MAXIPIME) 2 g in dextrose 5 % 50 mL IVPB 2 g 100 mL/hr   08/01/17 0010 New Bag/Given   vancomycin (VANCOCIN) IVPB 1000 mg/200 mL premix 1,000 mg 200 mL/hr   08/01/17 0404 Given  [new admit, system downtime, pharmacy delay]   sulfamethoxazole-trimethoprim (BACTRIM,SEPTRA) 400-80 MG per tablet 1 tablet 1 tablet    08/01/17 0404 Given  [new admit, system downtime, pharmacy delay]   azithromycin (ZITHROMAX) 200 MG/5ML suspension 1,200 mg 1,200 mg    08/01/17 0706 New Bag/Given   vancomycin (VANCOCIN) IVPB 750 mg/150  ml premix 750 mg 150 mL/hr   08/01/17 0843 Given   sulfamethoxazole-trimethoprim (BACTRIM,SEPTRA) 400-80 MG per tablet 1 tablet 1 tablet    08/01/17 0958 New Bag/Given   ceFEPIme (MAXIPIME) 2 g in dextrose 5 % 50 mL IVPB 2 g 100 mL/hr      MEDICATIONS: . azithromycin  1,200 mg Oral Weekly  . ferrous sulfate  325 mg Oral BID WC  . nystatin  5 mL Oral QID  . sulfamethoxazole-trimethoprim  1 tablet Oral BID    Review of Systems - 11 systems reviewed and negative per HPI   OBJECTIVE: Temp:  [98.8 F (37.1 C)-103.1 F (39.5 C)] 100 F (37.8 C) (08/16 0706) Pulse Rate:  [94-136] 94 (08/16 0706) Resp:  [18-23] 19 (08/16 0056) BP: (102-121)/(58-80) 116/63 (08/16 0706) SpO2:  [97 %-100 %] 100 % (08/16 0706) Weight:  [58.5 kg (129 lb)-59.1 kg (130 lb 6.4 oz)] 59.1 kg (130 lb 6.4 oz) (08/16 0056) Physical Exam  Constitutional: He is oriented to person, place, and time. He appears thin, No distress.  HENT: anicteric, pale Mouth/Throat: Oropharynx -no thrush Cardiovascular: Normal rate, regular rhythm and normal heart sounds..  Pulmonary/Chest: bil rhonchi Abdominal: Soft. Bowel sounds are normal. He exhibits no distension. There is no tenderness.  Lymphadenopathy: He has no cervical adenopathy.  Neurological: He is alert and oriented to person,  place, and time.  Skin: Skin is warm and dry. No rash noted. No erythema.  Psychiatric: He has a normal mood and affect. His behavior is normal.   LABS: Results for orders placed or performed during the hospital encounter of 07/31/17 (from the past 48 hour(s))  Lipase, blood     Status: Abnormal   Collection Time: 07/31/17  6:14 PM  Result Value Ref Range   Lipase 52 (H) 11 - 51 U/L  Comprehensive metabolic panel     Status: Abnormal   Collection Time: 07/31/17  6:14 PM  Result Value Ref Range   Sodium 130 (L) 135 - 145 mmol/L   Potassium 4.4 3.5 - 5.1 mmol/L   Chloride 97 (L) 101 - 111 mmol/L   CO2 23 22 - 32 mmol/L   Glucose, Bld 133  (H) 65 - 99 mg/dL   BUN 21 (H) 6 - 20 mg/dL   Creatinine, Ser 1.56 (H) 0.61 - 1.24 mg/dL   Calcium 8.8 (L) 8.9 - 10.3 mg/dL   Total Protein 7.8 6.5 - 8.1 g/dL   Albumin 3.6 3.5 - 5.0 g/dL   AST 284 (H) 15 - 41 U/L   ALT 90 (H) 17 - 63 U/L   Alkaline Phosphatase 82 38 - 126 U/L   Total Bilirubin 1.0 0.3 - 1.2 mg/dL   GFR calc non Af Amer 60 (L) >60 mL/min   GFR calc Af Amer >60 >60 mL/min    Comment: (NOTE) The eGFR has been calculated using the CKD EPI equation. This calculation has not been validated in all clinical situations. eGFR's persistently <60 mL/min signify possible Chronic Kidney Disease.    Anion gap 10 5 - 15  CBC     Status: Abnormal   Collection Time: 07/31/17  6:14 PM  Result Value Ref Range   WBC 1.8 (L) 3.8 - 10.6 K/uL   RBC 3.97 (L) 4.40 - 5.90 MIL/uL   Hemoglobin 10.4 (L) 13.0 - 18.0 g/dL   HCT 31.8 (L) 40.0 - 52.0 %   MCV 80.1 80.0 - 100.0 fL   MCH 26.1 26.0 - 34.0 pg   MCHC 32.6 32.0 - 36.0 g/dL   RDW 18.5 (H) 11.5 - 14.5 %   Platelets 159 150 - 440 K/uL  Lactic acid, plasma     Status: None   Collection Time: 07/31/17  9:40 PM  Result Value Ref Range   Lactic Acid, Venous 1.6 0.5 - 1.9 mmol/L  Blood Culture (routine x 2)     Status: None (Preliminary result)   Collection Time: 07/31/17 11:22 PM  Result Value Ref Range   Specimen Description BLOOD RAC    Special Requests      BOTTLES DRAWN AEROBIC AND ANAEROBIC Blood Culture results may not be optimal due to an excessive volume of blood received in culture bottles   Culture NO GROWTH < 12 HOURS    Report Status PENDING   Blood Culture (routine x 2)     Status: None (Preliminary result)   Collection Time: 07/31/17 11:22 PM  Result Value Ref Range   Specimen Description BLOOD LAC    Special Requests      BOTTLES DRAWN AEROBIC AND ANAEROBIC Blood Culture adequate volume   Culture NO GROWTH < 12 HOURS    Report Status PENDING   CBC     Status: Abnormal   Collection Time: 08/01/17  3:36 AM  Result  Value Ref Range   WBC 1.3 (LL) 3.8 - 10.6 K/uL  Comment: CRITICAL RESULT CALLED TO, READ BACK BY AND VERIFIED WITH: JENNIFER COBLE AT 0456 08/01/17 ALV    RBC 3.06 (L) 4.40 - 5.90 MIL/uL   Hemoglobin 8.1 (L) 13.0 - 18.0 g/dL    Comment: RESULT REPEATED AND VERIFIED   HCT 24.4 (L) 40.0 - 52.0 %   MCV 79.8 (L) 80.0 - 100.0 fL   MCH 26.6 26.0 - 34.0 pg   MCHC 33.4 32.0 - 36.0 g/dL   RDW 17.9 (H) 11.5 - 14.5 %   Platelets 105 (L) 150 - 440 K/uL  Comprehensive metabolic panel     Status: Abnormal   Collection Time: 08/01/17  3:36 AM  Result Value Ref Range   Sodium 133 (L) 135 - 145 mmol/L   Potassium 4.5 3.5 - 5.1 mmol/L   Chloride 106 101 - 111 mmol/L   CO2 23 22 - 32 mmol/L   Glucose, Bld 97 65 - 99 mg/dL   BUN 19 6 - 20 mg/dL   Creatinine, Ser 1.06 0.61 - 1.24 mg/dL   Calcium 7.6 (L) 8.9 - 10.3 mg/dL   Total Protein 5.9 (L) 6.5 - 8.1 g/dL   Albumin 2.8 (L) 3.5 - 5.0 g/dL   AST 298 (H) 15 - 41 U/L   ALT 74 (H) 17 - 63 U/L   Alkaline Phosphatase 85 38 - 126 U/L   Total Bilirubin 0.8 0.3 - 1.2 mg/dL   GFR calc non Af Amer >60 >60 mL/min   GFR calc Af Amer >60 >60 mL/min    Comment: (NOTE) The eGFR has been calculated using the CKD EPI equation. This calculation has not been validated in all clinical situations. eGFR's persistently <60 mL/min signify possible Chronic Kidney Disease.    Anion gap 4 (L) 5 - 15  Urinalysis, Complete w Microscopic     Status: Abnormal   Collection Time: 08/01/17  4:00 AM  Result Value Ref Range   Color, Urine AMBER (A) YELLOW    Comment: BIOCHEMICALS MAY BE AFFECTED BY COLOR   APPearance HAZY (A) CLEAR   Specific Gravity, Urine 1.024 1.005 - 1.030   pH 6.0 5.0 - 8.0   Glucose, UA NEGATIVE NEGATIVE mg/dL   Hgb urine dipstick SMALL (A) NEGATIVE   Bilirubin Urine NEGATIVE NEGATIVE   Ketones, ur 5 (A) NEGATIVE mg/dL   Protein, ur 100 (A) NEGATIVE mg/dL   Nitrite NEGATIVE NEGATIVE   Leukocytes, UA NEGATIVE NEGATIVE   RBC / HPF 0-5 0 - 5  RBC/hpf   WBC, UA 0-5 0 - 5 WBC/hpf   Bacteria, UA NONE SEEN NONE SEEN   Squamous Epithelial / LPF 0-5 (A) NONE SEEN   Mucous PRESENT    No components found for: ESR, C REACTIVE PROTEIN MICRO: Recent Results (from the past 720 hour(s))  Blood Culture (routine x 2)     Status: None   Collection Time: 07/16/17 12:41 PM  Result Value Ref Range Status   Specimen Description BLOOD RIGHT AC  Final   Special Requests   Final    BOTTLES DRAWN AEROBIC AND ANAEROBIC Blood Culture results may not be optimal due to an excessive volume of blood received in culture bottles   Culture NO GROWTH 5 DAYS  Final   Report Status 07/21/2017 FINAL  Final  Blood Culture (routine x 2)     Status: None   Collection Time: 07/16/17 12:41 PM  Result Value Ref Range Status   Specimen Description BLOOD LEFT [facility]  Final   Special Requests  Final    BOTTLES DRAWN AEROBIC AND ANAEROBIC Blood Culture adequate volume   Culture NO GROWTH 5 DAYS  Final   Report Status 07/21/2017 FINAL  Final  Culture, sputum-assessment     Status: None   Collection Time: 07/17/17  3:32 AM  Result Value Ref Range Status   Specimen Description SPU  Final   Special Requests NONE  Final   Sputum evaluation THIS SPECIMEN IS ACCEPTABLE FOR SPUTUM CULTURE  Final   Report Status 07/17/2017 FINAL  Final  Culture, respiratory (NON-Expectorated)     Status: None   Collection Time: 07/17/17  3:32 AM  Result Value Ref Range Status   Specimen Description SPU  Final   Special Requests NONE Reflexed from [id number]  Final   Gram Stain   Final    RARE WBC PRESENT, PREDOMINANTLY PMN MODERATE GRAM POSITIVE COCCI IN PAIRS IN CLUSTERS FEW GRAM POSITIVE RODS FEW GRAM NEGATIVE RODS RARE YEAST    Culture   Final    Consistent with normal respiratory flora. Performed at [facility], [place] [address], [place], [place] [address]    Report Status 07/19/2017 FINAL  Final  Acid Fast Smear (AFB)     Status: None   Collection Time: 07/17/17  3:32 AM   Result Value Ref Range Status   AFB Specimen Processing Concentration  Final   Acid Fast Smear Negative  Final    Comment: (NOTE) Performed At: [facility] [place], [place] [id number] Lindon Romp MD SE:[id number]    Source (AFB) EXPECTORATED SPUTUM  Final  Chlamydia/NGC rt PCR ([place] only)     Status: None   Collection Time: 07/17/17  7:00 PM  Result Value Ref Range Status   Specimen source GC/Chlam URINE, RANDOM  Final   Chlamydia Tr NOT DETECTED NOT DETECTED Final   N gonorrhoeae NOT DETECTED NOT DETECTED Final    Comment: (NOTE) 100  This methodology has not been evaluated in pregnant women or in 200  patients with a history of hysterectomy. 300 400  This methodology will not be performed on patients less than 72  years of age.   Culture, blood (Routine x 2)     Status: None   Collection Time: 07/26/17  2:43 PM  Result Value Ref Range Status   Specimen Description BLOOD LEFT ARM  Final   Special Requests   Final    BOTTLES DRAWN AEROBIC AND ANAEROBIC Blood Culture adequate volume   Culture NO GROWTH 5 DAYS  Final   Report Status 07/31/2017 FINAL  Final  Culture, blood (Routine x 2)     Status: None   Collection Time: 07/26/17  3:31 PM  Result Value Ref Range Status   Specimen Description BLOOD RIGHT ANTECUBITAL  Final   Special Requests   Final    BOTTLES DRAWN AEROBIC AND ANAEROBIC Blood Culture adequate volume   Culture NO GROWTH 5 DAYS  Final   Report Status 07/31/2017 FINAL  Final  Blood Culture (routine x 2)     Status: None (Preliminary result)   Collection Time: 07/31/17 11:22 PM  Result Value Ref Range Status   Specimen Description BLOOD RAC  Final   Special Requests   Final    BOTTLES DRAWN AEROBIC AND ANAEROBIC Blood Culture results may not be optimal due to an excessive volume of blood received in culture bottles   Culture NO GROWTH < 12 HOURS  Final   Report Status PENDING  Incomplete  Blood Culture (routine  x 2)     Status:  None (Preliminary result)   Collection Time: 07/31/17 11:22 PM  Result Value Ref Range Status   Specimen Description BLOOD LAC  Final   Special Requests   Final    BOTTLES DRAWN AEROBIC AND ANAEROBIC Blood Culture adequate volume   Culture NO GROWTH < 12 HOURS  Final   Report Status PENDING  Incomplete    IMAGING: Dg Chest 2 View  Result Date: 07/26/2017 CLINICAL DATA:  Fever and tachycardia.  HIV positive. EXAM: CHEST  2 VIEW COMPARISON:  07/19/2017 and 07/16/2017 FINDINGS: The heart size and mediastinal contours are within normal limits. Mild bilateral lower lobe infiltrates show slight improvement since previous studies. No new or worsening areas of pulmonary opacity are seen. No evidence of pleural effusion. IMPRESSION: Mild improvement in bilateral lower lobe infiltrates compared to recent studies. No new or progressive disease identified. Electronically Signed   By: Earle Gell M.D.   On: 07/26/2017 15:28   Dg Chest 2 View  Result Date: 07/19/2017 CLINICAL DATA:  History of pneumonia.  Shortness of breath . EXAM: CHEST  2 VIEW COMPARISON:  CT chest x-ray 07/16/2017. FINDINGS: Mediastinum and hilar structures normal. Heart size stable. 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 No interim change. No pleural effusion or pneumothorax. No acute bony abnormality. IMPRESSION: Persistent multifocal multinodular infiltrates. No change from prior exam. Electronically Signed   By: Marcello Moores  Register   On: 07/19/2017 08:18   Dg Chest 2 View  Result Date: 07/16/2017 CLINICAL DATA:  On and off right-sided chest pain. EXAM: CHEST  2 VIEW COMPARISON:  05/08/2017 FINDINGS: There are streaky basal opacities bilaterally. Bronchopneumonia or atelectasis can have this appearance. No edema, effusion, or pneumothorax. Normal heart size and mediastinal contours. IMPRESSION: Bilateral bronchopneumonia or atelectasis in the lower lungs. Electronically Signed   By: Monte Fantasia M.D.   On: 07/16/2017  08:09   Ct Angio Chest Pe W And/or Wo Contrast  Result Date: 07/16/2017 CLINICAL DATA:  Chest pain, elevated D-dimer. EXAM: CT ANGIOGRAPHY CHEST WITH CONTRAST TECHNIQUE: Multidetector CT imaging of the chest was performed using the standard protocol during bolus administration of intravenous contrast. Multiplanar CT image reconstructions and MIPs were obtained to evaluate the vascular anatomy. CONTRAST:  75 cc Isovue 370 IV COMPARISON:  Chest x-ray earlier today FINDINGS: Cardiovascular: No filling defects in the pulmonary arteries to suggest pulmonary emboli. Heart is normal size. Aorta is normal caliber. Mediastinum/Nodes: No mediastinal, hilar, or axillary adenopathy. Trachea and esophagus are unremarkable. Lungs/Pleura: Nodular airspace disease in the right upper lobe, right middle lobe, lingula and left lower lobe most compatible with multifocal pneumonia. No effusions. Upper Abdomen: Imaging into the upper abdomen shows no acute findings. Musculoskeletal: Chest wall soft tissues are unremarkable. No acute bony abnormality. Review of the MIP images confirms the above findings. IMPRESSION: Multifocal nodular airspace disease most compatible with pneumonia. No evidence of pulmonary embolus. Electronically Signed   By: Rolm Baptise M.D.   On: 07/16/2017 11:16   Dg Abdomen Acute W/chest  Result Date: 07/31/2017 CLINICAL DATA:  Recent gagging episodes EXAM: DG ABDOMEN ACUTE W/ 1V CHEST COMPARISON:  07/26/2017 FINDINGS: Cardiac shadow is stable. Previously seen basilar infiltrates have resolved in the interval. No new focal infiltrate is seen. Scattered large and small bowel gas is noted. No obstructive changes are seen. No abnormal mass or abnormal calcifications are seen. No acute bony abnormality is noted. IMPRESSION: No acute abnormality seen. Electronically Signed   By: Inez Catalina  M.D.   On: 07/31/2017 21:53   US Abdomen Limited Ruq  Result Date: 07/16/2017 CLINICAL DATA:  Right quadrant pain.  EXAM: ULTRASOUND ABDOMEN LIMITED RIGHT UPPER QUADRANT COMPARISON:  06/05/2017. FINDINGS: Gallbladder: No gallstones or wall thickening visualized. No sonographic Murphy sign noted by sonographer. Common bile duct: Diameter: 2.6 mm Liver: No focal lesion identified. Within normal limits in parenchymal echogenicity. IMPRESSION: No acute or focal abnormality identified. Electronically Signed   By: Marcello Moores  Register   On: 07/16/2017 09:21    Assessment:   Jeremiah Reed is a 26 y.o. male with recent admission for  bil pna and newly dxed HIV CD4 41, VL 8 million at last admission   He has had a 20# wt loss, night sweats and now cough. Has diarrhea as well after episodes of constipation. Was being treated with bactrim but no readmitted with nausea and dry heaves. At last admission AFB neg x 1, QF gold neg, SPutum with routine flora, PCP unable to be done, Crypto ag neg,  Has profound anemia and seen by onc, had neg parvovirus DNA. He has met with our HIV SW [facility] and is apply in for ADAP  Recommendations Will try to get 2 more sputum for AFB Continue bactrim but needs higher dose of 15-20 mg /kg of TMP component  This is 2 tabs tid of bactrim DS- I have increased dose but will need this higher dose at time of DC Given leukopenia and neutropenia (ANC 700) will treat with oral levo for a 7 day course as well We are looking into getting ART to start prior to DC   Thank you very much for allowing me to participate in the care of this patient. Please call with questions.   Cheral Marker. Ola Spurr, MD

## 2017-08-01 NOTE — Progress Notes (Signed)
Initial Nutrition Assessment  DOCUMENTATION CODES:   Severe malnutrition in context of chronic illness  INTERVENTION:  1. Boost Breeze po TID, each supplement provides 250 kcal and 9 grams of protein 2. Monitor for diet advancement, provide Mighty Shake II BID with advancement.  NUTRITION DIAGNOSIS:   Malnutrition related to chronic illness as evidenced by energy intake < 75% for > or equal to 1 month, percent weight loss.  GOAL:   Patient will meet greater than or equal to 90% of their needs  MONITOR:   PO intake, Supplement acceptance, I & O's, Labs, Diet advancement, Weight trends  REASON FOR ASSESSMENT:   Malnutrition Screening Tool    ASSESSMENT:   Jeremiah Reed is a 26 yo male with PMH of GERD, Anxiety, Heaptitis A, recent HIV diagnosis presents with Sepsis secondary to HCAP. He is not currently on ART therapy. Recently been admitted multiple times with PCP Pneumonia. Recently lost 20 pounds per chart. He reports he started losing weight back in March when he lost his appetite.    As of recent, often doesn't eat breakfast, but when he does, eats [place] bacon, eggs, grits, eats a sandwich for lunch, mashed potatoes for dinner. Complains he recently had his wisdom teeth removed, and was forced to eat soft food for a time but when he went for an outpatient follow up, began to eat solid food again. He was 165 lbs as of 04/18/2017, now 130 lbs, indicating a 21% severe wt loss. Likely not meeting needs as an outpatient.   250mL UOP yesterday  Medications reviewed and include:  Iron, NS at 11025mL/hr  Labs reviewed:  Na 133, Lipase 52, LFTs elevated    Diet Order:  Diet clear liquid Room service appropriate? Yes; Fluid consistency: Thin  Skin:  Reviewed, no issues  Last BM:  PTA  Height:   Ht Readings from Last 1 Encounters:  08/01/17 5\' 7"  (1.702 m)    Weight:   Wt Readings from Last 1 Encounters:  08/01/17 130 lb 6.4 oz (59.1 kg)    Ideal Body Weight:  67.27  kg  BMI:  Body mass index is 20.42 kg/m.  Estimated Nutritional Needs:   Kcal:  [id number] calories (35-40 cal/kg)  Protein:  89-101 grams (1.5-1.7g/kg)  Fluid:  2.1-2.3L  EDUCATION NEEDS:   No education needs identified at this time  Dionne AnoWilliam M. Lucielle Vokes, MS, RD LDN Inpatient Clinical Dietitian Pager [phone]

## 2017-08-01 NOTE — Progress Notes (Addendum)
Pharmacy Antibiotic Note  Jeremiah Reed is a 26 y.o. male admitted on 07/31/2017 with NVD and prior admit for pnuemonia, patient is HIV+.  Pharmacy has been consulted for vanc/cefepime and helping w/ azithromycin/bactrim dosing.  Plan: Patient received vanc 1g IV x 1 and cefepime 2g IV x 1 in ED  Will continue vanc 750 mg IV q12h w/ 8 hour stack dose. Will continue cefepime 2g IV q12h. Will draw a vanc trough 8/17 @ 2200 prior 4th dose. ke 0.0539 T1/2 12 hours Goal trough 15 - 20 mcg/mL  Patient has a CD4 T cell count < 50 (CD4 count 43 on 8/1). Will start PCP prophylaxis w/ bactrim DS 1 tablet daily. Will start MAC prophylaxis w/ azithromycin 1200 mg po every 7 days.  Height: 5' 7"  (170.2 cm) Weight: 129 lb (58.5 kg) IBW/kg (Calculated) : 66.1  Temp (24hrs), Avg:102 F (38.9 C), Min:100.9 F (38.3 C), Max:103.1 F (39.5 C)   Recent Labs Lab 07/26/17 1324 07/26/17 1443 07/26/17 1734 07/31/17 1814 07/31/17 2140  WBC 3.0* 3.0*  --  1.8*  --   CREATININE  --  0.95  --  1.56*  --   LATICACIDVEN  --  2.0* 1.0  --  1.6    Estimated Creatinine Clearance: 59.4 mL/min (A) (by C-G formula based on SCr of 1.56 mg/dL (H)).    No Known Allergies   Thank you for allowing pharmacy to be a part of this patient's care.  Tobie Lords, PharmD, BCPS Clinical Pharmacist 08/01/2017

## 2017-08-01 NOTE — Progress Notes (Signed)
Will switch patient from azithromycin 1200 mg tablet weekly to azithromycin 1200 mg suspension weekly since 600 mg tablets are unavailable.  Will start azithromycin 1200 mg (30 ml) by mouth once weekly for MAC prophylaxis.  Tobie Lords, PharmD, BCPS Clinical Pharmacist 08/01/2017

## 2017-08-02 ENCOUNTER — Other Ambulatory Visit: Payer: Self-pay | Admitting: Infectious Diseases

## 2017-08-02 ENCOUNTER — Encounter: Payer: Self-pay | Admitting: Infectious Diseases

## 2017-08-02 ENCOUNTER — Inpatient Hospital Stay: Payer: Self-pay

## 2017-08-02 ENCOUNTER — Encounter: Payer: Self-pay | Admitting: Radiology

## 2017-08-02 LAB — COMPREHENSIVE METABOLIC PANEL
ALT: 83 U/L — ABNORMAL HIGH (ref 17–63)
ANION GAP: 5 (ref 5–15)
AST: 288 U/L — ABNORMAL HIGH (ref 15–41)
Albumin: 2.7 g/dL — ABNORMAL LOW (ref 3.5–5.0)
Alkaline Phosphatase: 89 U/L (ref 38–126)
BUN: 11 mg/dL (ref 6–20)
CHLORIDE: 105 mmol/L (ref 101–111)
CO2: 21 mmol/L — AB (ref 22–32)
Calcium: 8.1 mg/dL — ABNORMAL LOW (ref 8.9–10.3)
Creatinine, Ser: 0.97 mg/dL (ref 0.61–1.24)
GFR calc non Af Amer: >60 mL/min (ref >60)
Glucose, Bld: 95 mg/dL (ref 65–99)
Potassium: 4 mmol/L (ref 3.5–5.1)
SODIUM: 131 mmol/L — AB (ref 135–145)
Total Bilirubin: 0.8 mg/dL (ref 0.3–1.2)
Total Protein: 6.3 g/dL — ABNORMAL LOW (ref 6.5–8.1)

## 2017-08-02 LAB — CBC
HCT: 27.3 % — ABNORMAL LOW (ref 40.0–52.0)
HEMOGLOBIN: 8.8 g/dL — AB (ref 13.0–18.0)
MCH: 26.3 pg (ref 26.0–34.0)
MCHC: 32.3 g/dL (ref 32.0–36.0)
MCV: 81.3 fL (ref 80.0–100.0)
Platelets: 96 10*3/uL — ABNORMAL LOW (ref 150–440)
RBC: 3.35 MIL/uL — AB (ref 4.40–5.90)
RDW: 18.1 % — ABNORMAL HIGH (ref 11.5–14.5)
WBC: 1 10*3/uL — AB (ref 3.8–10.6)

## 2017-08-02 MED ORDER — IBUPROFEN 600 MG PO TABS
600.0000 mg | ORAL_TABLET | Freq: Once | ORAL | Status: AC
  Administered 2017-08-02: 600 mg via ORAL
  Filled 2017-08-02: qty 1

## 2017-08-02 MED ORDER — IOPAMIDOL (ISOVUE-300) INJECTION 61%
100.0000 mL | Freq: Once | INTRAVENOUS | Status: AC | PRN
  Administered 2017-08-02: 100 mL via INTRAVENOUS

## 2017-08-02 MED ORDER — IOPAMIDOL (ISOVUE-300) INJECTION 61%
15.0000 mL | INTRAVENOUS | Status: AC
  Administered 2017-08-02: 15 mL via ORAL

## 2017-08-02 MED ORDER — AZITHROMYCIN 200 MG/5ML PO SUSR: 1200.0000 mg | ORAL | Status: DC

## 2017-08-02 MED ORDER — ALUM & MAG HYDROXIDE-SIMETH 200-200-20 MG/5ML PO SUSP
30.0000 mL | Freq: Four times a day (QID) | ORAL | Status: DC | PRN
  Administered 2017-08-02 – 2017-08-08 (×4): 30 mL via ORAL
  Filled 2017-08-02 (×4): qty 30

## 2017-08-02 MED ORDER — LOPERAMIDE HCL 2 MG PO CAPS
4.0000 mg | ORAL_CAPSULE | ORAL | Status: DC | PRN
  Administered 2017-08-02: 4 mg via ORAL
  Filled 2017-08-02: qty 2

## 2017-08-02 MED ORDER — LEVOFLOXACIN 500 MG PO TABS
750.0000 mg | ORAL_TABLET | Freq: Every day | ORAL | Status: DC
  Filled 2017-08-02: qty 2

## 2017-08-02 MED ORDER — BICTEGRAVIR-EMTRICITAB-TENOFOV 50-200-25 MG PO TABS: 1.0000 | ORAL_TABLET | Freq: Every day | ORAL | 11 refills | Status: DC

## 2017-08-02 MED ORDER — LEVOFLOXACIN 500 MG PO TABS
750.0000 mg | ORAL_TABLET | Freq: Every day | ORAL | Status: DC
  Administered 2017-08-02 – 2017-08-07 (×5): 750 mg via ORAL
  Filled 2017-08-02 (×5): qty 2

## 2017-08-02 NOTE — Progress Notes (Signed)
[place] at [place] NAME: Jeremiah Reed    MR#:  [id number]  DATE OF BIRTH:  1990/12/20  SUBJECTIVE:  CHIEF COMPLAINT:   Chief Complaint  Patient presents with  . Emesis   Still febrile. Nausea better   REVIEW OF SYSTEMS:    Review of Systems  Constitutional: Positive for malaise/fatigue. Negative for chills and fever.  HENT: Negative for sore throat.   Eyes: Negative for blurred vision, double vision and pain.  Respiratory: Positive for cough. Negative for hemoptysis, shortness of breath and wheezing.   Cardiovascular: Negative for chest pain, palpitations, orthopnea and leg swelling.  Gastrointestinal: Positive for nausea. Negative for abdominal pain, constipation, diarrhea, heartburn and vomiting.  Genitourinary: Negative for dysuria and hematuria.  Musculoskeletal: Negative for back pain and joint pain.  Skin: Negative for rash.  Neurological: Positive for weakness. Negative for sensory change, speech change, focal weakness and headaches.  Endo/Heme/Allergies: Does not bruise/bleed easily.  Psychiatric/Behavioral: Negative for depression. The patient is not nervous/anxious.     DRUG ALLERGIES:  No Known Allergies  VITALS:  Blood pressure (!) 103/55, pulse (!) 102, temperature 99 F (37.2 C), resp. rate 16, height _0  (1.702 m), weight 59.1 kg (130 lb 6.4 oz), SpO2 95 %.  PHYSICAL EXAMINATION:   Physical Exam  GENERAL:  26 y.o.-year-old patient lying in the bed with no acute distress.  EYES: Pupils equal, round, reactive to light and accommodation. No scleral icterus. Extraocular muscles intact.  HEENT: Head atraumatic, normocephalic. Oropharynx and nasopharynx clear.  NECK:  Supple, no jugular venous distention. No thyroid enlargement, no tenderness.  LUNGS: Normal breath sounds bilaterally, no wheezing, rales, rhonchi. No use of accessory muscles of respiration.  CARDIOVASCULAR: S1, S2 normal. No murmurs, rubs, or  gallops.  ABDOMEN: Soft, nontender, nondistended. Bowel sounds present. No organomegaly or mass.  EXTREMITIES: No cyanosis, clubbing or edema b/l.    NEUROLOGIC: Cranial nerves II through XII are intact. No focal Motor or sensory deficits b/l.   PSYCHIATRIC: The patient is alert and oriented x 3.  SKIN: No obvious rash, lesion, or ulcer.   LABORATORY PANEL:   CBC  Recent Labs Lab 08/02/17 0454  WBC 1.0*  HGB 8.8*  HCT 27.3*  PLT 96*   ------------------------------------------------------------------------------------------------------------------ Chemistries   Recent Labs Lab 08/02/17 0454  NA 131*  K 4.0  CL 105  CO2 21*  GLUCOSE 95  BUN 11  CREATININE 0.97  CALCIUM 8.1*  AST 288*  ALT 83*  ALKPHOS 89  BILITOT 0.8   ------------------------------------------------------------------------------------------------------------------  Cardiac Enzymes No results for input(s): TROPONINI in the last 168 hours. ------------------------------------------------------------------------------------------------------------------  RADIOLOGY:  Dg Abdomen Acute W/chest  Result Date: 07/31/2017 CLINICAL DATA:  Recent gagging episodes EXAM: DG ABDOMEN ACUTE W/ 1V CHEST COMPARISON:  07/26/2017 FINDINGS: Cardiac shadow is stable. Previously seen basilar infiltrates have resolved in the interval. No new focal infiltrate is seen. Scattered large and small bowel gas is noted. No obstructive changes are seen. No abnormal mass or abnormal calcifications are seen. No acute bony abnormality is noted. IMPRESSION: No acute abnormality seen. Electronically Signed   By: Inez Catalina M.D.   On: 07/31/2017 21:53     ASSESSMENT AND PLAN:   This is a 26 y.o. male with a history of HIV/AIDS, anxiety/depression, GERD now being admitted with:  #. Sepsis likely Healthcare Associated Pneumonia - Admit to inpatient - Stop IV Cefepime & Vancomycin. Change to levaquin continue Bactrim for PCP and  azithro for  MAC - Duonebs - Follow up blood & sputum cultures. Still negative Appreciate ID input. Discussed with Dr. Ola Spurr. w/u for MAI/CMV  #. 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  CODE STATUS: FULL CODE  DVT Prophylaxis: SCDs  TOTAL TIME TAKING CARE OF THIS PATIENT: 30 minutes.   POSSIBLE D/C IN 1-2 DAYS, DEPENDING ON CLINICAL CONDITION.  Hillary Bow R M.D on 08/02/2017 at 6:26 PM  Between 7am to 6pm - Pager - [phone]  After 6pm go to [email or web address] - password [email or web address] [place]  Office  [phone]  CC: Primary care physician; Patient, No Pcp Per  Note: This dictation was prepared with Dragon dictation along with smaller phrase technology. Any transcriptional errors that result from this process are unintentional.

## 2017-08-02 NOTE — Care Management (Addendum)
This RNCM has faxed Rx from Dr. Jarrett Ables office for [facility] to be filled through [facility] pharmacy with voucher information per [facility].  Fax addressed to Dellie Burns as this is not [facility] related. Hand delivered Rx for this medication. [facility] pharmacy will call/deliver medication when it is ready.

## 2017-08-02 NOTE — Care Management (Signed)
Delivered Biktarvy prescription (#30 pills) to patient at bedside.

## 2017-08-02 NOTE — Progress Notes (Signed)
[facility] CLINIC INFECTIOUS DISEASE PROGRESS NOTE Date of Admission:  07/31/2017     ID: Jeremiah Reed is a 26 y.o. male with HIV/AIDS, fevers Active Problems:   HCAP (healthcare-associated pneumonia)   Subjective: Still febrile to 103, some diarrhea but given immodium. He has no further nausea.   ROS  [employer] systems are reviewed and negative except per hpi  Medications:  Antibiotics Given (last 72 hours)    Date/Time Action Medication Dose Rate   07/31/17 2340 New Bag/Given  [Pharmacy delayed]   ceFEPIme (MAXIPIME) 2 g in dextrose 5 % 50 mL IVPB 2 g 100 mL/hr   08/01/17 0010 New Bag/Given   vancomycin (VANCOCIN) IVPB 1000 mg/200 mL premix 1,000 mg 200 mL/hr   08/01/17 0404 Given  [new admit, system downtime, pharmacy delay]   sulfamethoxazole-trimethoprim (BACTRIM,SEPTRA) 400-80 MG per tablet 1 tablet 1 tablet    08/01/17 0404 Given  [new admit, system downtime, pharmacy delay]   azithromycin (ZITHROMAX) 200 MG/5ML suspension 1,200 mg 1,200 mg    08/01/17 0706 New Bag/Given   vancomycin (VANCOCIN) IVPB 750 mg/150 ml premix 750 mg 150 mL/hr   08/01/17 0843 Given   sulfamethoxazole-trimethoprim (BACTRIM,SEPTRA) 400-80 MG per tablet 1 tablet 1 tablet    08/01/17 0958 New Bag/Given   ceFEPIme (MAXIPIME) 2 g in dextrose 5 % 50 mL IVPB 2 g 100 mL/hr   08/01/17 1722 Given   sulfamethoxazole-trimethoprim (BACTRIM DS,SEPTRA DS) 800-160 MG per tablet 2 tablet 2 tablet    08/01/17 2005 New Bag/Given   vancomycin (VANCOCIN) IVPB 750 mg/150 ml premix 750 mg 150 mL/hr   08/01/17 2012 Given   sulfamethoxazole-trimethoprim (BACTRIM DS,SEPTRA DS) 800-160 MG per tablet 2 tablet 2 tablet    08/01/17 2302 New Bag/Given   ceFEPIme (MAXIPIME) 2 g in dextrose 5 % 50 mL IVPB 2 g 100 mL/hr   08/02/17 0719 New Bag/Given   vancomycin (VANCOCIN) IVPB 750 mg/150 ml premix 750 mg 150 mL/hr   08/02/17 0835 New Bag/Given   ceFEPIme (MAXIPIME) 2 g in dextrose 5 % 50 mL IVPB 2 g 100 mL/hr   08/02/17 0835  Given   sulfamethoxazole-trimethoprim (BACTRIM DS,SEPTRA DS) 800-160 MG per tablet 2 tablet 2 tablet      . azithromycin  1,200 mg Oral Weekly  . ferrous sulfate  325 mg Oral BID WC  . levofloxacin  750 mg Oral Daily  . nystatin  5 mL Oral QID  . sulfamethoxazole-trimethoprim  2 tablet Oral TID    Objective: Vital signs in last 24 hours: Temp:  [99 F (37.2 C)-103.2 F (39.6 C)] 99 F (37.2 C) (08/17 1102) Pulse Rate:  [102-114] 102 (08/17 0723) Resp:  [16] 16 (08/16 2308) BP: (103-124)/(55-73) 103/55 (08/17 0723) SpO2:  [95 %-99 %] 95 % (08/17 0723) Constitutional: He is oriented to person, place, and time. He appears thin, No distress.  HENT: anicteric, pale Mouth/Throat: Oropharynx -no thrush Cardiovascular: Normal rate, regular rhythm and normal heart sounds..  Pulmonary/Chest: bil rhonchi Abdominal: Soft. Bowel sounds are normal. He exhibits no distension. There is no tenderness.  Lymphadenopathy: He has no cervical adenopathy.  Neurological: He is alert and oriented to person, place, and time.  Skin: Skin is warm and dry. No rash noted. No erythema.  Psychiatric: He has a normal mood and affect. His behavior is normal.   Lab Results  Recent Labs  08/01/17 0336 08/02/17 0454  WBC 1.3* 1.0*  HGB 8.1* 8.8*  HCT 24.4* 27.3*  NA 133* 131*  K 4.5 4.0  CL 106 105  CO2 23 21*  BUN 19 11  CREATININE 1.06 0.97    Microbiology: Results for orders placed or performed during the hospital encounter of 07/31/17  Blood Culture (routine x 2)     Status: None (Preliminary result)   Collection Time: 07/31/17 11:22 PM  Result Value Ref Range Status   Specimen Description BLOOD RAC  Final   Special Requests   Final    BOTTLES DRAWN AEROBIC AND ANAEROBIC Blood Culture results may not be optimal due to an excessive volume of blood received in culture bottles   Culture NO GROWTH 2 DAYS  Final   Report Status PENDING  Incomplete  Blood Culture (routine x 2)     Status: None  (Preliminary result)   Collection Time: 07/31/17 11:22 PM  Result Value Ref Range Status   Specimen Description BLOOD LAC  Final   Special Requests   Final    BOTTLES DRAWN AEROBIC AND ANAEROBIC Blood Culture adequate volume   Culture NO GROWTH 2 DAYS  Final   Report Status PENDING  Incomplete    Studies/Results: Dg Abdomen Acute W/chest  Result Date: 07/31/2017 CLINICAL DATA:  Recent gagging episodes EXAM: DG ABDOMEN ACUTE W/ 1V CHEST COMPARISON:  07/26/2017 FINDINGS: Cardiac shadow is stable. Previously seen basilar infiltrates have resolved in the interval. No new focal infiltrate is seen. Scattered large and small bowel gas is noted. No obstructive changes are seen. No abnormal mass or abnormal calcifications are seen. No acute bony abnormality is noted. IMPRESSION: No acute abnormality seen. Electronically Signed   By: Alcide Clever M.D.   On: 07/31/2017 21:53    Assessment/Plan: Jeremiah Reed is a 26 y.o. male with recent admission for  bil pna and newly dxed HIV CD4 43, VL 8 million at last admission   He has had a 20# wt loss, night sweats and now cough. Has diarrhea as well after episodes of constipation. Was being treated with bactrim but now readmitted with nausea and dry heaves. At last admission AFB neg x 1, QF gold neg, Sputum with routine flora, PCP unable to be done, Crypto ag neg,  Has profound anemia and seen by onc, had neg parvovirus DNA.  He continues to have fevers and night sweats and had some diarrhea. Given his very low CD4 and markedly elevated VL of 8 million this is likely related to the HIV but need to continue to evaluate for opportunistic infections. Disseminated MAI can cause the anemia, fevers, sweats and wt loss with the diarrhea so need to rule that out. Disseminated CMV also possible.   Recommendations Continue bactrim at high dosing  of 15-20 mg /kg of TMP component  This is 2 tabs tid of bactrim DS Given leukopenia and neutropenia (ANC 700) will continue  levofloxacin for 7 days Check CT abd chest Check AFB blood culture and stool cx Check CMV PCR Pharmacy has been able to obtain biktarvy and I have advised him to take one daily. He is at high risk of continue infection, readmission and IRIS so would keep in hospital over weekend pending above results Thank you very much for the consult. Will follow with you.  Mick Sell   08/02/2017, 3:08 PM

## 2017-08-03 LAB — EXPECTORATED SPUTUM ASSESSMENT W GRAM STAIN, RFLX TO RESP C

## 2017-08-03 LAB — C DIFFICILE QUICK SCREEN W PCR REFLEX
C DIFFICILE (CDIFF) INTERP: NOT DETECTED
C Diff antigen: NEGATIVE
C Diff toxin: NEGATIVE

## 2017-08-03 MED ORDER — IBUPROFEN 400 MG PO TABS
400.0000 mg | ORAL_TABLET | Freq: Four times a day (QID) | ORAL | Status: DC | PRN
  Administered 2017-08-04: 400 mg via ORAL
  Filled 2017-08-03: qty 1

## 2017-08-03 NOTE — Progress Notes (Signed)
[place] at [place] NAME: Jeremiah Reed    MR#:  [id number]  DATE OF BIRTH:  06-Jul-1991  SUBJECTIVE:  CHIEF COMPLAINT:   Chief Complaint  Patient presents with  . Emesis   Still febrile. Nausea better  Loose watery stool with incontinence.  REVIEW OF SYSTEMS:    Review of Systems  Constitutional: Positive for malaise/fatigue. Negative for chills and fever.  HENT: Negative for sore throat.   Eyes: Negative for blurred vision, double vision and pain.  Respiratory: Positive for cough. Negative for hemoptysis, shortness of breath and wheezing.   Cardiovascular: Negative for chest pain, palpitations, orthopnea and leg swelling.  Gastrointestinal: Positive for diarrhea. Negative for abdominal pain, constipation, heartburn and vomiting.  Genitourinary: Negative for dysuria and hematuria.  Musculoskeletal: Negative for back pain and joint pain.  Skin: Negative for rash.  Neurological: Positive for weakness. Negative for sensory change, speech change, focal weakness and headaches.  Endo/Heme/Allergies: Does not bruise/bleed easily.  Psychiatric/Behavioral: Negative for depression. The patient is not nervous/anxious.     DRUG ALLERGIES:  No Known Allergies  VITALS:  Blood pressure (!) 115/57, pulse (!) 108, temperature (!) 101.7 F (38.7 C), temperature source Oral, resp. rate 18, height 5' 7"  (1.702 m), weight 59.1 kg (130 lb 6.4 oz), SpO2 100 %.  PHYSICAL EXAMINATION:   Physical Exam  GENERAL:  26 y.o.-year-old patient lying in the bed with no acute distress.  EYES: Pupils equal, round, reactive to light and accommodation. No scleral icterus. Extraocular muscles intact.  HEENT: Head atraumatic, normocephalic. Oropharynx and nasopharynx clear.  NECK:  Supple, no jugular venous distention. No thyroid enlargement, no tenderness.  LUNGS: Normal breath sounds bilaterally, no wheezing, rales, rhonchi. No use of accessory muscles of  respiration.  CARDIOVASCULAR: S1, S2 normal. No murmurs, rubs, or gallops.  ABDOMEN: Soft, nontender, nondistended. Bowel sounds present. No organomegaly or mass.  EXTREMITIES: No cyanosis, clubbing or edema b/l.    NEUROLOGIC: Cranial nerves II through XII are intact. No focal Motor or sensory deficits b/l.   PSYCHIATRIC: The patient is alert and oriented x 3.  SKIN: No obvious rash, lesion, or ulcer.   LABORATORY PANEL:   CBC  Recent Labs Lab 08/02/17 0454  WBC 1.0*  HGB 8.8*  HCT 27.3*  PLT 96*   ------------------------------------------------------------------------------------------------------------------ Chemistries   Recent Labs Lab 08/02/17 0454  NA 131*  K 4.0  CL 105  CO2 21*  GLUCOSE 95  BUN 11  CREATININE 0.97  CALCIUM 8.1*  AST 288*  ALT 83*  ALKPHOS 89  BILITOT 0.8   ------------------------------------------------------------------------------------------------------------------  Cardiac Enzymes No results for input(s): TROPONINI in the last 168 hours. ------------------------------------------------------------------------------------------------------------------  RADIOLOGY:  Ct Chest W Contrast  Result Date: 08/03/2017 CLINICAL DATA:  New diagnosis of HIV, cough and shortness of breath with fever and chills. Right-sided chest pain. Night sweats. Weight loss. EXAM: CT CHEST, ABDOMEN, AND PELVIS WITH CONTRAST TECHNIQUE: Multidetector CT imaging of the chest, abdomen and pelvis was performed following the standard protocol during bolus administration of intravenous contrast. CONTRAST:  122m ISOVUE-300 IOPAMIDOL (ISOVUE-300) INJECTION 61% COMPARISON:  07/16/2017 CT chest. 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 Vascular structures are otherwise unremarkable. Heart size normal. Small amount of pericardial fluid, increased/new from 07/16/2017. Mediastinum/Nodes: No pathologically  enlarged mediastinal, hilar or right axillary lymph nodes. Left axillary lymph nodes are not considered enlarged by CT size criteria. Esophagus is grossly unremarkable. Lungs/Pleura: Interval improvement  in patchy areas of ill-defined peribronchovascular nodularity and nodular consolidation, without complete resolution. Scattered volume loss and mild bronchiectasis in the right middle lobe, lingula and left lower lobe. No pleural fluid. Airway is unremarkable. Musculoskeletal: No worrisome lytic or sclerotic lesions. CT ABDOMEN PELVIS FINDINGS Hepatobiliary: Liver and gallbladder are unremarkable. No biliary ductal dilatation. Pancreas: Negative. Spleen: Marked parenchymal heterogeneity, increased from prior. Within normal limits for size, 11.9 cm. Adrenals/Urinary Tract: Adrenal glands and kidneys are unremarkable. Ureters are decompressed. Bladder is grossly unremarkable. Stomach/Bowel: Stomach, small bowel, appendix and colon are unremarkable. Vascular/Lymphatic: Scattered lymph nodes are subcentimeter in short axis size. Gastrohepatic ligament lymph node measures 8 mm. Reproductive: Prostate is normal in size. Other: No free fluid.  Mesenteries and peritoneum are unremarkable. Musculoskeletal: No worrisome lytic or sclerotic lesions. IMPRESSION: 1. Small pericardial effusion, new. 2. Interval improvement in patchy areas of ill-defined peribronchovascular nodularity and nodular consolidation in the lungs, indicative of an infectious bronchiolitis/bronchopneumonia, without complete resolution. 3. Splenic heterogeneity, increased from 07/16/2017, possibly due to phase of contrast enhancement. HIV involvement is not excluded. Electronically Signed   By: Lorin Picket M.D.   On: 08/03/2017 07:31   Ct Abdomen Pelvis W Contrast  Result Date: 08/03/2017 CLINICAL DATA:  New diagnosis of HIV, cough and shortness of breath with fever and chills. Right-sided chest pain. Night sweats. Weight loss. EXAM: CT CHEST,  ABDOMEN, AND PELVIS WITH CONTRAST TECHNIQUE: Multidetector CT imaging of the chest, abdomen and pelvis was performed following the standard protocol during bolus administration of intravenous contrast. CONTRAST:  115m ISOVUE-300 IOPAMIDOL (ISOVUE-300) INJECTION 61% COMPARISON:  07/16/2017 CT chest. 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 Vascular structures are otherwise unremarkable. Heart size normal. Small amount of pericardial fluid, increased/new from 07/16/2017. Mediastinum/Nodes: No pathologically enlarged mediastinal, hilar or right axillary lymph nodes. Left axillary lymph nodes are not considered enlarged by CT size criteria. Esophagus is grossly unremarkable. Lungs/Pleura: Interval improvement in patchy areas of ill-defined peribronchovascular nodularity and nodular consolidation, without complete resolution. Scattered volume loss and mild bronchiectasis in the right middle lobe, lingula and left lower lobe. No pleural fluid. Airway is unremarkable. Musculoskeletal: No worrisome lytic or sclerotic lesions. CT ABDOMEN PELVIS FINDINGS Hepatobiliary: Liver and gallbladder are unremarkable. No biliary ductal dilatation. Pancreas: Negative. Spleen: Marked parenchymal heterogeneity, increased from prior. Within normal limits for size, 11.9 cm. Adrenals/Urinary Tract: Adrenal glands and kidneys are unremarkable. Ureters are decompressed. Bladder is grossly unremarkable. Stomach/Bowel: Stomach, small bowel, appendix and colon are unremarkable. Vascular/Lymphatic: Scattered lymph nodes are subcentimeter in short axis size. Gastrohepatic ligament lymph node measures 8 mm. Reproductive: Prostate is normal in size. Other: No free fluid.  Mesenteries and peritoneum are unremarkable. Musculoskeletal: No worrisome lytic or sclerotic lesions. IMPRESSION: 1. Small pericardial effusion, new. 2. Interval improvement in patchy areas of ill-defined  peribronchovascular nodularity and nodular consolidation in the lungs, indicative of an infectious bronchiolitis/bronchopneumonia, without complete resolution. 3. Splenic heterogeneity, increased from 07/16/2017, possibly due to phase of contrast enhancement. HIV involvement is not excluded. Electronically Signed   By: MLorin PicketM.D.   On: 08/03/2017 07:31     ASSESSMENT AND PLAN:   This is a 26y.o. male with a history of HIV/AIDS, anxiety/depression, GERD now being admitted with:  #. Sepsis likely Healthcare Associated Pneumonia - Admit to inpatient - Stopped IV Cefepime & Vancomycin. Change to levaquin continue Bactrim for PCP and azithro for MAC - Duonebs - Follow up blood & sputum cultures. Still  negative Appreciate ID input. Discussed with Dr. Ola Spurr. w/u for MAI/CMV  # Diarrhea With Febrile episodes will check for C diff, Stool PCR, Ova and parasites  #. 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  CODE STATUS: FULL CODE  DVT Prophylaxis: SCDs  TOTAL TIME TAKING CARE OF THIS PATIENT: 30 minutes.   POSSIBLE D/C IN 1-2 DAYS, DEPENDING ON CLINICAL CONDITION.  Hillary Bow R M.D on 08/03/2017 at 9:31 AM  Between 7am to 6pm - Pager - [phone]  After 6pm go to [email or web address] - password [email or web address] [facility]  Office  [phone]  CC: Primary care physician; Patient, No Pcp Per  Note: This dictation was prepared with Dragon dictation along with smaller phrase technology. Any transcriptional errors that result from this process are unintentional.

## 2017-08-04 DIAGNOSIS — R112 Nausea with vomiting, unspecified: Secondary | ICD-10-CM

## 2017-08-04 DIAGNOSIS — D61818 Other pancytopenia: Secondary | ICD-10-CM

## 2017-08-04 DIAGNOSIS — B20 Human immunodeficiency virus [HIV] disease: Principal | ICD-10-CM

## 2017-08-04 DIAGNOSIS — R509 Fever, unspecified: Secondary | ICD-10-CM

## 2017-08-04 DIAGNOSIS — R634 Abnormal weight loss: Secondary | ICD-10-CM

## 2017-08-04 DIAGNOSIS — Z79899 Other long term (current) drug therapy: Secondary | ICD-10-CM

## 2017-08-04 DIAGNOSIS — R197 Diarrhea, unspecified: Secondary | ICD-10-CM

## 2017-08-04 DIAGNOSIS — R231 Pallor: Secondary | ICD-10-CM

## 2017-08-04 LAB — MAGNESIUM: Magnesium: 1.9 mg/dL (ref 1.7–2.4)

## 2017-08-04 LAB — GASTROINTESTINAL PANEL BY PCR, STOOL (REPLACES STOOL CULTURE)
ASTROVIRUS: NOT DETECTED
Adenovirus F40/41: NOT DETECTED
CYCLOSPORA CAYETANENSIS: NOT DETECTED
Campylobacter species: NOT DETECTED
Cryptosporidium: NOT DETECTED
ENTAMOEBA HISTOLYTICA: NOT DETECTED
ENTEROAGGREGATIVE E COLI (EAEC): NOT DETECTED
Enteropathogenic E coli (EPEC): NOT DETECTED
Enterotoxigenic E coli (ETEC): NOT DETECTED
GIARDIA LAMBLIA: DETECTED — AB
NOROVIRUS GI/GII: NOT DETECTED
Plesimonas shigelloides: NOT DETECTED
Rotavirus A: NOT DETECTED
SALMONELLA SPECIES: NOT DETECTED
Sapovirus (I, II, IV, and V): NOT DETECTED
Shiga like toxin producing E coli (STEC): NOT DETECTED
Shigella/Enteroinvasive E coli (EIEC): NOT DETECTED
VIBRIO CHOLERAE: NOT DETECTED
VIBRIO SPECIES: NOT DETECTED
Yersinia enterocolitica: NOT DETECTED

## 2017-08-04 LAB — CBC WITH DIFFERENTIAL/PLATELET
Basophils Absolute: 0 10*3/uL (ref 0–0.1)
Basophils Relative: 0 %
EOS ABS: 0 10*3/uL (ref 0–0.7)
EOS PCT: 3 %
HCT: 22.1 % — ABNORMAL LOW (ref 40.0–52.0)
Hemoglobin: 7.3 g/dL — ABNORMAL LOW (ref 13.0–18.0)
LYMPHS ABS: 0.1 10*3/uL — AB (ref 1.0–3.6)
LYMPHS PCT: 9 %
MCH: 26 pg (ref 26.0–34.0)
MCHC: 32.9 g/dL (ref 32.0–36.0)
MCV: 78.9 fL — AB (ref 80.0–100.0)
MONO ABS: 0.1 10*3/uL — AB (ref 0.2–1.0)
Monocytes Relative: 21 %
Neutro Abs: 0.5 10*3/uL — ABNORMAL LOW (ref 1.4–6.5)
Neutrophils Relative %: 67 %
PLATELETS: 69 10*3/uL — AB (ref 150–440)
RBC: 2.8 MIL/uL — AB (ref 4.40–5.90)
RDW: 17.9 % — AB (ref 11.5–14.5)
WBC: 0.7 10*3/uL — AB (ref 3.8–10.6)

## 2017-08-04 LAB — BASIC METABOLIC PANEL
Anion gap: 4 — ABNORMAL LOW (ref 5–15)
BUN: 7 mg/dL (ref 6–20)
CALCIUM: 7.7 mg/dL — AB (ref 8.9–10.3)
CO2: 18 mmol/L — ABNORMAL LOW (ref 22–32)
Chloride: 112 mmol/L — ABNORMAL HIGH (ref 101–111)
Creatinine, Ser: 0.81 mg/dL (ref 0.61–1.24)
GFR calc Af Amer: >60 mL/min (ref >60)
GLUCOSE: 92 mg/dL (ref 65–99)
POTASSIUM: 3.8 mmol/L (ref 3.5–5.1)
SODIUM: 134 mmol/L — AB (ref 135–145)

## 2017-08-04 LAB — LACTATE DEHYDROGENASE: LDH: 835 U/L — ABNORMAL HIGH (ref 98–192)

## 2017-08-04 LAB — RETICULOCYTES
RBC.: 2.7 MIL/uL — ABNORMAL LOW (ref 4.40–5.90)
Retic Count, Absolute: 5.4 10*3/uL — ABNORMAL LOW (ref 19.0–183.0)
Retic Ct Pct: 0.2 % — ABNORMAL LOW (ref 0.4–3.1)

## 2017-08-04 MED ORDER — METRONIDAZOLE 500 MG PO TABS
500.0000 mg | ORAL_TABLET | Freq: Three times a day (TID) | ORAL | Status: DC
  Administered 2017-08-04 – 2017-08-09 (×16): 500 mg via ORAL
  Filled 2017-08-04 (×16): qty 1

## 2017-08-04 NOTE — Progress Notes (Signed)
[facility] NAME: Jeremiah Reed    MR#:  [id number]  DATE OF BIRTH:  05/28/1991  SUBJECTIVE:  CHIEF COMPLAINT:   Chief Complaint  Patient presents with  . Emesis   Febrile in the morning but fever curve improving Diarrhea improved  REVIEW OF SYSTEMS:    Review of Systems  Constitutional: Positive for malaise/fatigue. Negative for chills and fever.  HENT: Negative for sore throat.   Eyes: Negative for blurred vision, double vision and pain.  Respiratory: Positive for cough. Negative for hemoptysis, shortness of breath and wheezing.   Cardiovascular: Negative for chest pain, palpitations, orthopnea and leg swelling.  Gastrointestinal: Positive for diarrhea. Negative for abdominal pain, constipation, heartburn and vomiting.  Genitourinary: Negative for dysuria and hematuria.  Musculoskeletal: Negative for back pain and joint pain.  Skin: Negative for rash.  Neurological: Positive for weakness. Negative for sensory change, speech change, focal weakness and headaches.  Endo/Heme/Allergies: Does not bruise/bleed easily.  Psychiatric/Behavioral: Negative for depression. The patient is not nervous/anxious.     DRUG ALLERGIES:  No Known Allergies  VITALS:  Blood pressure (!) 106/58, pulse 96, temperature 100.1 F (37.8 C), temperature source Oral, resp. rate 16, height 5' 7"  (1.702 m), weight 59.1 kg (130 lb 6.4 oz), SpO2 99 %.  PHYSICAL EXAMINATION:   Physical Exam  GENERAL:  25 y.o.-year-old patient lying in the bed with no acute distress.  EYES: Pupils equal, round, reactive to light and accommodation. No scleral icterus. Extraocular muscles intact.  HEENT: Head atraumatic, normocephalic. Oropharynx and nasopharynx clear.  NECK:  Supple, no jugular venous distention. No thyroid enlargement, no tenderness.  LUNGS: Normal breath sounds bilaterally, no wheezing, rales, rhonchi. No use of accessory muscles of respiration.   CARDIOVASCULAR: S1, S2 normal. No murmurs, rubs, or gallops.  ABDOMEN: Soft, nontender, nondistended. Bowel sounds present. No organomegaly or mass.  EXTREMITIES: No cyanosis, clubbing or edema b/l.    NEUROLOGIC: Cranial nerves II through XII are intact. No focal Motor or sensory deficits b/l.   PSYCHIATRIC: The patient is alert and oriented x 3.  SKIN: No obvious rash, lesion, or ulcer.   LABORATORY PANEL:   CBC  Recent Labs Lab 08/04/17 0405  WBC 0.7*  HGB 7.3*  HCT 22.1*  PLT 69*   ------------------------------------------------------------------------------------------------------------------ Chemistries   Recent Labs Lab 08/02/17 0454 08/04/17 0405  NA 131* 134*  K 4.0 3.8  CL 105 112*  CO2 21* 18*  GLUCOSE 95 92  BUN 11 7  CREATININE 0.97 0.81  CALCIUM 8.1* 7.7*  MG  --  1.9  AST 288*  --   ALT 83*  --   ALKPHOS 89  --   BILITOT 0.8  --    ------------------------------------------------------------------------------------------------------------------  Cardiac Enzymes No results for input(s): TROPONINI in the last 168 hours. ------------------------------------------------------------------------------------------------------------------  RADIOLOGY:  Ct Chest W Contrast  Result Date: 08/03/2017 CLINICAL DATA:  New diagnosis of HIV, cough and shortness of breath with fever and chills. Right-sided chest pain. Night sweats. Weight loss. EXAM: CT CHEST, ABDOMEN, AND PELVIS WITH CONTRAST TECHNIQUE: Multidetector CT imaging of the chest, abdomen and pelvis was performed following the standard protocol during bolus administration of intravenous contrast. CONTRAST:  139m ISOVUE-300 IOPAMIDOL (ISOVUE-300) INJECTION 61% COMPARISON:  07/16/2017 CT chest. 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 Vascular structures are otherwise unremarkable. Heart size normal. Small amount of pericardial fluid,  increased/new from 07/16/2017. Mediastinum/Nodes: No pathologically enlarged  mediastinal, hilar or right axillary lymph nodes. Left axillary lymph nodes are not considered enlarged by CT size criteria. Esophagus is grossly unremarkable. Lungs/Pleura: Interval improvement in patchy areas of ill-defined peribronchovascular nodularity and nodular consolidation, without complete resolution. Scattered volume loss and mild bronchiectasis in the right middle lobe, lingula and left lower lobe. No pleural fluid. Airway is unremarkable. Musculoskeletal: No worrisome lytic or sclerotic lesions. CT ABDOMEN PELVIS FINDINGS Hepatobiliary: Liver and gallbladder are unremarkable. No biliary ductal dilatation. Pancreas: Negative. Spleen: Marked parenchymal heterogeneity, increased from prior. Within normal limits for size, 11.9 cm. Adrenals/Urinary Tract: Adrenal glands and kidneys are unremarkable. Ureters are decompressed. Bladder is grossly unremarkable. Stomach/Bowel: Stomach, small bowel, appendix and colon are unremarkable. Vascular/Lymphatic: Scattered lymph nodes are subcentimeter in short axis size. Gastrohepatic ligament lymph node measures 8 mm. Reproductive: Prostate is normal in size. Other: No free fluid.  Mesenteries and peritoneum are unremarkable. Musculoskeletal: No worrisome lytic or sclerotic lesions. IMPRESSION: 1. Small pericardial effusion, new. 2. Interval improvement in patchy areas of ill-defined peribronchovascular nodularity and nodular consolidation in the lungs, indicative of an infectious bronchiolitis/bronchopneumonia, without complete resolution. 3. Splenic heterogeneity, increased from 07/16/2017, possibly due to phase of contrast enhancement. HIV involvement is not excluded. Electronically Signed   By: Lorin Picket M.D.   On: 08/03/2017 07:31   Ct Abdomen Pelvis W Contrast  Result Date: 08/03/2017 CLINICAL DATA:  New diagnosis of HIV, cough and shortness of breath with fever and chills.  Right-sided chest pain. Night sweats. Weight loss. EXAM: CT CHEST, ABDOMEN, AND PELVIS WITH CONTRAST TECHNIQUE: Multidetector CT imaging of the chest, abdomen and pelvis was performed following the standard protocol during bolus administration of intravenous contrast. CONTRAST:  132m ISOVUE-300 IOPAMIDOL (ISOVUE-300) INJECTION 61% COMPARISON:  07/16/2017 CT chest. 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 Vascular structures are otherwise unremarkable. Heart size normal. Small amount of pericardial fluid, increased/new from 07/16/2017. Mediastinum/Nodes: No pathologically enlarged mediastinal, hilar or right axillary lymph nodes. Left axillary lymph nodes are not considered enlarged by CT size criteria. Esophagus is grossly unremarkable. Lungs/Pleura: Interval improvement in patchy areas of ill-defined peribronchovascular nodularity and nodular consolidation, without complete resolution. Scattered volume loss and mild bronchiectasis in the right middle lobe, lingula and left lower lobe. No pleural fluid. Airway is unremarkable. Musculoskeletal: No worrisome lytic or sclerotic lesions. CT ABDOMEN PELVIS FINDINGS Hepatobiliary: Liver and gallbladder are unremarkable. No biliary ductal dilatation. Pancreas: Negative. Spleen: Marked parenchymal heterogeneity, increased from prior. Within normal limits for size, 11.9 cm. Adrenals/Urinary Tract: Adrenal glands and kidneys are unremarkable. Ureters are decompressed. Bladder is grossly unremarkable. Stomach/Bowel: Stomach, small bowel, appendix and colon are unremarkable. Vascular/Lymphatic: Scattered lymph nodes are subcentimeter in short axis size. Gastrohepatic ligament lymph node measures 8 mm. Reproductive: Prostate is normal in size. Other: No free fluid.  Mesenteries and peritoneum are unremarkable. Musculoskeletal: No worrisome lytic or sclerotic lesions. IMPRESSION: 1. Small pericardial  effusion, new. 2. Interval improvement in patchy areas of ill-defined peribronchovascular nodularity and nodular consolidation in the lungs, indicative of an infectious bronchiolitis/bronchopneumonia, without complete resolution. 3. Splenic heterogeneity, increased from 07/16/2017, possibly due to phase of contrast enhancement. HIV involvement is not excluded. Electronically Signed   By: MLorin PicketM.D.   On: 08/03/2017 07:31     ASSESSMENT AND PLAN:   This is a 26y.o. male with a history of HIV/AIDS, anxiety/depression, GERD now being admitted with:  #. Sepsis likely Healthcare Associated Pneumonia - Stopped IV Cefepime &  Vancomycin. Change to levaquin continue Bactrim for PCP and azithro for MAC - Follow up blood & sputum cultures. Still negative Appreciate ID input. Discussed with Dr. Ola Spurr. w/u for MAI/CMV underway.  # Giardia diarrhea Start metronidazole. Tinidazole not available.  # Diarrhea With Febrile episodes will check for C diff, Stool PCR, Ova and parasites  #. 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 Tish Men and he will see patient May need bone marrow biopsy  All the records are reviewed and case discussed with Care Management/Social Worker Management plans discussed with the patient, family and they are in agreement.  CODE STATUS: FULL CODE  DVT Prophylaxis: SCDs  TOTAL TIME TAKING CARE OF THIS PATIENT: 30 minutes.   POSSIBLE D/C IN 1-2 DAYS, DEPENDING ON CLINICAL CONDITION.  Hillary Bow R M.D on 08/04/2017 at 11:12 AM  Between 7am to 6pm - Pager - [phone]  After 6pm go to [email or web address] - password [email or web address] [facility]  Office  [phone]  CC: Primary care physician; Patient, No Pcp Per  Note: This dictation was prepared with Dragon dictation along with smaller phrase technology. Any transcriptional errors that result from this process are  unintentional.

## 2017-08-04 NOTE — Consult Note (Signed)
[facility] CONSULT NOTE  Patient Care Team: Patient, No Pcp Per as PCP - General (General Practice)  CHIEF COMPLAINTS/PURPOSE OF CONSULTATION:  AIDS/pancytopenia/recurrent fevers  HISTORY OF PRESENTING ILLNESS:  Jeremiah Reed 26 y.o.  male with the new diagnosis of AIDS- is currently readmitted to the hospital for recurrent fevers/worsening pancytopenia.  Patient was a bisexual- was recently diagnosed with HIV/AIDS; this significantly elevated viral load. Given his fevers at presentation- suspicion for PCP- patient is on Bactrim/and also Levaquin. Patient was also started on ART therapy 3 days ago. That is significant clinical suspicion for MAI infection.   Today's white count is 1.0 hemoglobin 8.8 platelets 96. Absolute neutrophil count of 500.   Patient complains of severe of nausea with vomiting yesterday. He continues to have intermittent fevers. No chills. Denies any lumps or bumps. Denies any abdominal pain. Patient had mild diarrhea presentation. Improved. Positive for weight loss of about 25 pounds in the last 3 months.   ROS: A complete 10 point review of system is done which is negative except mentioned above in history of present illness  MEDICAL HISTORY:  Past Medical History:  Diagnosis Date  . Anxiety   . Depression   . GERD (gastroesophageal reflux disease)   . Heart murmur   . Hepatitis A   . HIV (human immunodeficiency virus infection) ([place])     SURGICAL HISTORY: Past Surgical History:  Procedure Laterality Date  . APPENDECTOMY      SOCIAL HISTORY: Social History   Social History  . Marital status: Single    Spouse name: N/A  . Number of children: N/A  . Years of education: N/A   Occupational History  . Not on file.   Social History Main Topics  . Smoking status: Never Smoker  . Smokeless tobacco: Never Used  . Alcohol use No  . Drug use: No  . Sexual activity: Not on file   Other Topics Concern  . Not on file   Social History  Narrative  . No narrative on file    FAMILY HISTORY: History reviewed. No pertinent family history.  ALLERGIES:  has No Known Allergies.  MEDICATIONS:  Current Facility-Administered Medications  Medication Dose Route Frequency Provider Last Rate Last Dose  . 0.9 %  sodium chloride infusion   Intravenous Continuous Hugelmeyer, Alexis, DO 125 mL/hr at 08/03/17 1359    . acetaminophen (TYLENOL) tablet 650 mg  650 mg Oral Q6H PRN Hugelmeyer, Alexis, DO   650 mg at 08/03/17 1645   Or  . acetaminophen (TYLENOL) suppository 650 mg  650 mg Rectal Q6H PRN Hugelmeyer, Alexis, DO      . albuterol (PROVENTIL) (2.5 MG/3ML) 0.083% nebulizer solution 2.5 mg  2.5 mg Nebulization Q6H PRN Hugelmeyer, Alexis, DO      . alum & mag hydroxide-simeth (MAALOX/MYLANTA) 200-200-20 MG/5ML suspension 30 mL  30 mL Oral Q6H PRN Hillary Bow, MD   30 mL at 08/02/17 1619  . [START ON 08/15/2017] azithromycin (ZITHROMAX) 200 MG/5ML suspension 1,200 mg  1,200 mg Oral Weekly Sudini, Srikar, MD      . bisacodyl (DULCOLAX) EC tablet 5 mg  5 mg Oral Daily PRN Hugelmeyer, Alexis, DO      . ferrous sulfate tablet 325 mg  325 mg Oral BID WC Hugelmeyer, Alexis, DO   325 mg at 08/04/17 0941  . ibuprofen (ADVIL,MOTRIN) tablet 400 mg  400 mg Oral Q6H PRN Hillary Bow, MD   400 mg at 08/04/17 0940  . ipratropium (ATROVENT) nebulizer  solution 0.5 mg  0.5 mg Nebulization Q6H PRN Hugelmeyer, Alexis, DO      . levofloxacin (LEVAQUIN) tablet 750 mg  750 mg Oral Daily Hillary Bow, MD   750 mg at 08/04/17 0941  . loperamide (IMODIUM) capsule 4 mg  4 mg Oral PRN Hugelmeyer, Alexis, DO   4 mg at 08/02/17 0220  . magnesium citrate solution 1 Bottle  1 Bottle Oral Once PRN Hugelmeyer, Alexis, DO      . metroNIDAZOLE (FLAGYL) tablet 500 mg  500 mg Oral Q8H Sudini, Srikar, MD      . nystatin (MYCOSTATIN) 100000 UNIT/ML suspension 500,000 Units  5 mL Oral QID Hugelmeyer, Alexis, DO   500,000 Units at 08/04/17 0941  . ondansetron (ZOFRAN)  tablet 4 mg  4 mg Oral Q6H PRN Hugelmeyer, Alexis, DO       Or  . ondansetron (ZOFRAN) injection 4 mg  4 mg Intravenous Q6H PRN Hugelmeyer, Alexis, DO   4 mg at 08/03/17 2226  . oxyCODONE (Oxy IR/ROXICODONE) immediate release tablet 5 mg  5 mg Oral Q4H PRN Hugelmeyer, Alexis, DO      . senna-docusate (Senokot-S) tablet 1 tablet  1 tablet Oral QHS PRN Hugelmeyer, Alexis, DO      . sulfamethoxazole-trimethoprim (BACTRIM DS,SEPTRA DS) 800-160 MG per tablet 2 tablet  2 tablet Oral TID Hillary Bow, MD   2 tablet at 08/04/17 0940  . zolpidem (AMBIEN) tablet 5 mg  5 mg Oral QHS PRN,MR X 1 Hugelmeyer, Alexis, DO          .  PHYSICAL EXAMINATION:  Vitals:   08/03/17 2056 08/04/17 0752  BP: (!) 100/50 (!) 106/58  Pulse: 88 96  Resp: 16 16  Temp: 98.4 F (36.9 C) 100.1 F (37.8 C)  SpO2: 99% 99%   Filed Weights   07/31/17 1805 08/01/17 0056  Weight: 129 lb (58.5 kg) 130 lb 6.4 oz (59.1 kg)    GENERAL: Well-nourished well-developed; Thin built. Alert, no distress and comfortable.   Alone. Patient appears nontoxic.  EYES:Positive for pallor S.  OROPHARYNX: no thrush or ulceration. NECK: supple, no masses felt LYMPH:  no palpable lymphadenopathy in the cervical, axillary or inguinal regions LUNGS: decreased breath sounds to auscultation at bases and  No wheeze or crackles HEART/CVS: regular rate & rhythm and no murmurs; No lower extremity edema ABDOMEN: abdomen soft, non-tender and normal bowel sounds Musculoskeletal:no cyanosis of digits and no clubbing  PSYCH: alert & oriented x 3 with fluent speech NEURO: no focal motor/sensory deficits SKIN:  no rashes or significant lesions  LABORATORY DATA:  I have reviewed the data as listed Lab Results  Component Value Date   WBC 0.7 (LL) 08/04/2017   HGB 7.3 (L) 08/04/2017   HCT 22.1 (L) 08/04/2017   MCV 78.9 (L) 08/04/2017   PLT 69 (L) 08/04/2017    Recent Labs  07/31/17 1814 08/01/17 0336 08/02/17 0454 08/04/17 0405  NA 130*  133* 131* 134*  K 4.4 4.5 4.0 3.8  CL 97* 106 105 112*  CO2 23 23 21* 18*  GLUCOSE 133* 97 95 92  BUN 21* 19 11 7   CREATININE 1.56* 1.06 0.97 0.81  CALCIUM 8.8* 7.6* 8.1* 7.7*  GFRNONAA 60* >60 >60 >60  GFRAA >60 >60 >60 >60  PROT 7.8 5.9* 6.3*  --   ALBUMIN 3.6 2.8* 2.7*  --   AST 284* 298* 288*  --   ALT 90* 74* 83*  --   ALKPHOS 82 85 89  --  BILITOT 1.0 0.8 0.8  --     RADIOGRAPHIC STUDIES: I have personally reviewed the radiological images as listed and agreed with the findings in the report. Dg Chest 2 View  Result Date: 07/26/2017 CLINICAL DATA:  Fever and tachycardia.  HIV positive. EXAM: CHEST  2 VIEW COMPARISON:  07/19/2017 and 07/16/2017 FINDINGS: The heart size and mediastinal contours are within normal limits. Mild bilateral lower lobe infiltrates show slight improvement since previous studies. No new or worsening areas of pulmonary opacity are seen. No evidence of pleural effusion. IMPRESSION: Mild improvement in bilateral lower lobe infiltrates compared to recent studies. No new or progressive disease identified. Electronically Signed   By: Earle Gell M.D.   On: 07/26/2017 15:28   Dg Chest 2 View  Result Date: 07/19/2017 CLINICAL DATA:  History of pneumonia.  Shortness of breath . EXAM: CHEST  2 VIEW COMPARISON:  CT chest x-ray 07/16/2017. FINDINGS: Mediastinum and hilar structures normal. Heart size stable. 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 No interim change. No pleural effusion or pneumothorax. No acute bony abnormality. IMPRESSION: Persistent multifocal multinodular infiltrates. No change from prior exam. Electronically Signed   By: Marcello Moores  Register   On: 07/19/2017 08:18   Dg Chest 2 View  Result Date: 07/16/2017 CLINICAL DATA:  On and off right-sided chest pain. EXAM: CHEST  2 VIEW COMPARISON:  05/08/2017 FINDINGS: There are streaky basal opacities bilaterally. Bronchopneumonia or atelectasis can have this appearance. No edema, effusion, or  pneumothorax. Normal heart size and mediastinal contours. IMPRESSION: Bilateral bronchopneumonia or atelectasis in the lower lungs. Electronically Signed   By: Monte Fantasia M.D.   On: 07/16/2017 08:09   Ct Chest W Contrast  Result Date: 08/03/2017 CLINICAL DATA:  New diagnosis of HIV, cough and shortness of breath with fever and chills. Right-sided chest pain. Night sweats. Weight loss. EXAM: CT CHEST, ABDOMEN, AND PELVIS WITH CONTRAST TECHNIQUE: Multidetector CT imaging of the chest, abdomen and pelvis was performed following the standard protocol during bolus administration of intravenous contrast. CONTRAST:  119m ISOVUE-300 IOPAMIDOL (ISOVUE-300) INJECTION 61% COMPARISON:  07/16/2017 CT chest. 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 Vascular structures are otherwise unremarkable. Heart size normal. Small amount of pericardial fluid, increased/new from 07/16/2017. Mediastinum/Nodes: No pathologically enlarged mediastinal, hilar or right axillary lymph nodes. Left axillary lymph nodes are not considered enlarged by CT size criteria. Esophagus is grossly unremarkable. Lungs/Pleura: Interval improvement in patchy areas of ill-defined peribronchovascular nodularity and nodular consolidation, without complete resolution. Scattered volume loss and mild bronchiectasis in the right middle lobe, lingula and left lower lobe. No pleural fluid. Airway is unremarkable. Musculoskeletal: No worrisome lytic or sclerotic lesions. CT ABDOMEN PELVIS FINDINGS Hepatobiliary: Liver and gallbladder are unremarkable. No biliary ductal dilatation. Pancreas: Negative. Spleen: Marked parenchymal heterogeneity, increased from prior. Within normal limits for size, 11.9 cm. Adrenals/Urinary Tract: Adrenal glands and kidneys are unremarkable. Ureters are decompressed. Bladder is grossly unremarkable. Stomach/Bowel: Stomach, small bowel, appendix and colon are  unremarkable. Vascular/Lymphatic: Scattered lymph nodes are subcentimeter in short axis size. Gastrohepatic ligament lymph node measures 8 mm. Reproductive: Prostate is normal in size. Other: No free fluid.  Mesenteries and peritoneum are unremarkable. Musculoskeletal: No worrisome lytic or sclerotic lesions. IMPRESSION: 1. Small pericardial effusion, new. 2. Interval improvement in patchy areas of ill-defined peribronchovascular nodularity and nodular consolidation in the lungs, indicative of an infectious bronchiolitis/bronchopneumonia, without complete resolution. 3. Splenic heterogeneity, increased from 07/16/2017, possibly due to phase of contrast enhancement.  HIV involvement is not excluded. Electronically Signed   By: Lorin Picket M.D.   On: 08/03/2017 07:31   Ct Angio Chest Pe W And/or Wo Contrast  Result Date: 07/16/2017 CLINICAL DATA:  Chest pain, elevated D-dimer. EXAM: CT ANGIOGRAPHY CHEST WITH CONTRAST TECHNIQUE: Multidetector CT imaging of the chest was performed using the standard protocol during bolus administration of intravenous contrast. Multiplanar CT image reconstructions and MIPs were obtained to evaluate the vascular anatomy. CONTRAST:  75 cc Isovue 370 IV COMPARISON:  Chest x-ray earlier today FINDINGS: Cardiovascular: No filling defects in the pulmonary arteries to suggest pulmonary emboli. Heart is normal size. Aorta is normal caliber. Mediastinum/Nodes: No mediastinal, hilar, or axillary adenopathy. Trachea and esophagus are unremarkable. Lungs/Pleura: Nodular airspace disease in the right upper lobe, right middle lobe, lingula and left lower lobe most compatible with multifocal pneumonia. No effusions. Upper Abdomen: Imaging into the upper abdomen shows no acute findings. Musculoskeletal: Chest wall soft tissues are unremarkable. No acute bony abnormality. Review of the MIP images confirms the above findings. IMPRESSION: Multifocal nodular airspace disease most compatible with  pneumonia. No evidence of pulmonary embolus. Electronically Signed   By: Rolm Baptise M.D.   On: 07/16/2017 11:16   Ct Abdomen Pelvis W Contrast  Result Date: 08/03/2017 CLINICAL DATA:  New diagnosis of HIV, cough and shortness of breath with fever and chills. Right-sided chest pain. Night sweats. Weight loss. EXAM: CT CHEST, ABDOMEN, AND PELVIS WITH CONTRAST TECHNIQUE: Multidetector CT imaging of the chest, abdomen and pelvis was performed following the standard protocol during bolus administration of intravenous contrast. CONTRAST:  139m ISOVUE-300 IOPAMIDOL (ISOVUE-300) INJECTION 61% COMPARISON:  07/16/2017 CT chest. 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 Vascular structures are otherwise unremarkable. Heart size normal. Small amount of pericardial fluid, increased/new from 07/16/2017. Mediastinum/Nodes: No pathologically enlarged mediastinal, hilar or right axillary lymph nodes. Left axillary lymph nodes are not considered enlarged by CT size criteria. Esophagus is grossly unremarkable. Lungs/Pleura: Interval improvement in patchy areas of ill-defined peribronchovascular nodularity and nodular consolidation, without complete resolution. Scattered volume loss and mild bronchiectasis in the right middle lobe, lingula and left lower lobe. No pleural fluid. Airway is unremarkable. Musculoskeletal: No worrisome lytic or sclerotic lesions. CT ABDOMEN PELVIS FINDINGS Hepatobiliary: Liver and gallbladder are unremarkable. No biliary ductal dilatation. Pancreas: Negative. Spleen: Marked parenchymal heterogeneity, increased from prior. Within normal limits for size, 11.9 cm. Adrenals/Urinary Tract: Adrenal glands and kidneys are unremarkable. Ureters are decompressed. Bladder is grossly unremarkable. Stomach/Bowel: Stomach, small bowel, appendix and colon are unremarkable. Vascular/Lymphatic: Scattered lymph nodes are subcentimeter in short  axis size. Gastrohepatic ligament lymph node measures 8 mm. Reproductive: Prostate is normal in size. Other: No free fluid.  Mesenteries and peritoneum are unremarkable. Musculoskeletal: No worrisome lytic or sclerotic lesions. IMPRESSION: 1. Small pericardial effusion, new. 2. Interval improvement in patchy areas of ill-defined peribronchovascular nodularity and nodular consolidation in the lungs, indicative of an infectious bronchiolitis/bronchopneumonia, without complete resolution. 3. Splenic heterogeneity, increased from 07/16/2017, possibly due to phase of contrast enhancement. HIV involvement is not excluded. Electronically Signed   By: MLorin PicketM.D.   On: 08/03/2017 07:31   Dg Abdomen Acute W/chest  Result Date: 07/31/2017 CLINICAL DATA:  Recent gagging episodes EXAM: DG ABDOMEN ACUTE W/ 1V CHEST COMPARISON:  07/26/2017 FINDINGS: Cardiac shadow is stable. Previously seen basilar infiltrates have resolved in the interval. No new focal infiltrate is seen. Scattered large and small bowel gas is noted. No  obstructive changes are seen. No abnormal mass or abnormal calcifications are seen. No acute bony abnormality is noted. IMPRESSION: No acute abnormality seen. Electronically Signed   By: Inez Catalina M.D.   On: 07/31/2017 21:53   US Abdomen Limited Ruq  Result Date: 07/16/2017 CLINICAL DATA:  Right quadrant pain. EXAM: ULTRASOUND ABDOMEN LIMITED RIGHT UPPER QUADRANT COMPARISON:  06/05/2017. FINDINGS: Gallbladder: No gallstones or wall thickening visualized. No sonographic Murphy sign noted by sonographer. Common bile duct: Diameter: 2.6 mm Liver: No focal lesion identified. Within normal limits in parenchymal echogenicity. IMPRESSION: No acute or focal abnormality identified. Electronically Signed   By: Marcello Moores  Register   On: 07/16/2017 09:21    ASSESSMENT & PLAN:  # 26 year old male patient with a diagnosis of AIDS- currently admitted to the hospital for recurrent fevers/worsening  pancytopenia.  # Recurrent fevers/worsening severe pancytopenia- the etiology is unclear. The differential includes opportunistic infections versus medication induced. Malignancy less likely. No obvious etiology noted on the CT scans. Recommend a bone marrow biopsy- for the evaluation of above. Discussed the procedure in detail; also potential complications of bleeding/pain and discomfort infection. However complication rate should be minimal. Would recommend having been done through IR. Patient agreeable.  # Hold off stimulating growth factors- while awaiting above workup.   # Discussed with Dr. Crawford Givens; and Dr. Darvin Neighbours.  Thank you Dr. Darvin Neighbours for allowing me to participate in the care of your pleasant patient. Please do not hesitate to contact me with questions or concerns in the interim. All questions were answered. The patient knows to call the clinic with any problems, questions or concerns.    Cammie Sickle, MD 08/04/2017 11:25 AM

## 2017-08-05 ENCOUNTER — Inpatient Hospital Stay: Payer: Self-pay

## 2017-08-05 DIAGNOSIS — R0609 Other forms of dyspnea: Secondary | ICD-10-CM

## 2017-08-05 LAB — CBC WITH DIFFERENTIAL/PLATELET
Basophils Absolute: 0 10*3/uL (ref 0–0.1)
Basophils Relative: 0 %
EOS ABS: 0 10*3/uL (ref 0–0.7)
Eosinophils Relative: 3 %
HCT: 21.1 % — ABNORMAL LOW (ref 40.0–52.0)
Hemoglobin: 6.9 g/dL — ABNORMAL LOW (ref 13.0–18.0)
LYMPHS ABS: 0.1 10*3/uL — AB (ref 1.0–3.6)
Lymphocytes Relative: 14 %
MCH: 26.4 pg (ref 26.0–34.0)
MCHC: 32.7 g/dL (ref 32.0–36.0)
MCV: 80.6 fL (ref 80.0–100.0)
Monocytes Absolute: 0.2 10*3/uL (ref 0.2–1.0)
Monocytes Relative: 30 %
NEUTROS PCT: 53 %
Neutro Abs: 0.4 10*3/uL — ABNORMAL LOW (ref 1.4–6.5)
Platelets: 64 10*3/uL — ABNORMAL LOW (ref 150–440)
RBC: 2.61 MIL/uL — ABNORMAL LOW (ref 4.40–5.90)
RDW: 19 % — ABNORMAL HIGH (ref 11.5–14.5)
WBC: 0.8 10*3/uL — AB (ref 3.8–10.6)

## 2017-08-05 LAB — CULTURE, BLOOD (ROUTINE X 2)
Culture: NO GROWTH
Culture: NO GROWTH
Special Requests: ADEQUATE

## 2017-08-05 LAB — PREPARE RBC (CROSSMATCH)

## 2017-08-05 LAB — PATHOLOGIST SMEAR REVIEW

## 2017-08-05 LAB — ABO/RH: ABO/RH(D): O POS

## 2017-08-05 MED ORDER — SULFAMETHOXAZOLE-TRIMETHOPRIM 800-160 MG PO TABS
1.0000 | ORAL_TABLET | Freq: Every day | ORAL | Status: DC
Start: 2017-08-06 — End: 2017-08-09
  Administered 2017-08-05 – 2017-08-09 (×4): 1 via ORAL
  Filled 2017-08-05 (×3): qty 1

## 2017-08-05 MED ORDER — SODIUM CHLORIDE 0.9 % IV SOLN
Freq: Once | INTRAVENOUS | Status: AC
  Administered 2017-08-06: 11:00:00 via INTRAVENOUS

## 2017-08-05 NOTE — Progress Notes (Signed)
Nutrition Follow-up  DOCUMENTATION CODES:   Severe malnutrition in context of chronic illness  INTERVENTION:  1. Monitor for needs, cater to patient preferences  NUTRITION DIAGNOSIS:   Malnutrition related to chronic illness as evidenced by energy intake < 75% for > or equal to 1 month, percent weight loss. -ongoing  GOAL:   Patient will meet greater than or equal to 90% of their needs -not meeting  MONITOR:   I & O's, Labs, Skin, PO intake, Weight trends  ASSESSMENT:   Jeremiah Reed is a 26 yo male with PMH of GERD, Anxiety, Heaptitis A, recent HIV diagnosis presents with Sepsis secondary to HCAP. He is not currently on ART therapy. Recently been admitted multiple times with PCP Pneumonia. Recently lost 20 pounds per chart. He reports he started losing weight back in March when he lost his appetite.  Started ART therapy 8/16  Spoke with Jeremiah Reed briefly. He reports he still isn't eating much, "when I feel bad in any way I don't eat." No documented meal completion so far. Breakfast tray was still sitting at bedside. Did not eat lunch. He did not want any snacks or supplements ordered at this time. Encouraged patient to eat as much as he could. Denies any nausea/vomiting at this time.  Medications reviewed and include:  Iron, Blood Transfusion today  Labs reviewed:  Na 134 HGB 6.9/21.1  Diet Order:  Diet regular Room service appropriate? Yes; Fluid consistency: Thin Diet NPO time specified Diet NPO time specified Except for: Sips with Meds  Skin:  Reviewed, no issues  Last BM:  08/04/2017  Height:   Ht Readings from Last 1 Encounters:  08/01/17 5\' 7"  (1.702 m)    Weight:   Wt Readings from Last 1 Encounters:  08/01/17 130 lb 6.4 oz (59.1 kg)    Ideal Body Weight:  67.27 kg  BMI:  Body mass index is 20.42 kg/m.  Estimated Nutritional Needs:   Kcal:  9021-1155 calories (35-40 cal/kg)  Protein:  89-101 grams (1.5-1.7g/kg)  Fluid:  2.1-2.3L  EDUCATION NEEDS:    No education needs identified at this time  Dionne Ano. Jeremiah Alphin, MS, RD LDN Inpatient Clinical Dietitian Pager [phone]

## 2017-08-05 NOTE — Progress Notes (Signed)
Paged MD on call to inform hgb of 6.9

## 2017-08-05 NOTE — Progress Notes (Signed)
Sent text page to MD on call regarding pt's hgb level.

## 2017-08-05 NOTE — Progress Notes (Signed)
[facility] CLINIC INFECTIOUS DISEASE PROGRESS NOTE Date of Admission:  07/31/2017     ID: Jeremiah Reed is a 26 y.o. male with HIV/AIDS, fevers Active Problems:   HCAP (healthcare-associated pneumonia)   Subjective: Fevers improving, less diarrhea. No HA, sob. Mild cough  ROS  Eleven systems are reviewed and negative except per hpi  Medications:  Antibiotics Given (last 72 hours)    Date/Time Action Medication Dose   08/02/17 1609 Given   levofloxacin (LEVAQUIN) tablet 750 mg 750 mg   08/02/17 1611 Given   sulfamethoxazole-trimethoprim (BACTRIM DS,SEPTRA DS) 800-160 MG per tablet 2 tablet 2 tablet   08/02/17 2104 Given   sulfamethoxazole-trimethoprim (BACTRIM DS,SEPTRA DS) 800-160 MG per tablet 2 tablet 2 tablet   08/03/17 0824 Given   sulfamethoxazole-trimethoprim (BACTRIM DS,SEPTRA DS) 800-160 MG per tablet 2 tablet 2 tablet   08/03/17 0824 Given   levofloxacin (LEVAQUIN) tablet 750 mg 750 mg   08/03/17 1629 Given   sulfamethoxazole-trimethoprim (BACTRIM DS,SEPTRA DS) 800-160 MG per tablet 2 tablet 2 tablet   08/03/17 2200 Given   sulfamethoxazole-trimethoprim (BACTRIM DS,SEPTRA DS) 800-160 MG per tablet 2 tablet 2 tablet   08/04/17 0940 Given   sulfamethoxazole-trimethoprim (BACTRIM DS,SEPTRA DS) 800-160 MG per tablet 2 tablet 2 tablet   08/04/17 0941 Given   levofloxacin (LEVAQUIN) tablet 750 mg 750 mg   08/04/17 1455 Given   sulfamethoxazole-trimethoprim (BACTRIM DS,SEPTRA DS) 800-160 MG per tablet 2 tablet 2 tablet   08/04/17 1455 Given   metroNIDAZOLE (FLAGYL) tablet 500 mg 500 mg   08/04/17 2200 Given   sulfamethoxazole-trimethoprim (BACTRIM DS,SEPTRA DS) 800-160 MG per tablet 2 tablet 2 tablet   08/04/17 2200 Given   metroNIDAZOLE (FLAGYL) tablet 500 mg 500 mg   08/05/17 0533 Given   metroNIDAZOLE (FLAGYL) tablet 500 mg 500 mg   08/05/17 2878 Given   sulfamethoxazole-trimethoprim (BACTRIM DS,SEPTRA DS) 800-160 MG per tablet 2 tablet 2 tablet   08/05/17 0922 Given    levofloxacin (LEVAQUIN) tablet 750 mg 750 mg   08/05/17 1336 Given   metroNIDAZOLE (FLAGYL) tablet 500 mg 500 mg     . [START ON 08/15/2017] azithromycin  1,200 mg Oral Weekly  . ferrous sulfate  325 mg Oral BID WC  . levofloxacin  750 mg Oral Daily  . metroNIDAZOLE  500 mg Oral Q8H  . nystatin  5 mL Oral QID  . sulfamethoxazole-trimethoprim  2 tablet Oral TID    Objective: Vital signs in last 24 hours: Temp:  [98.1 F (36.7 C)-98.7 F (37.1 C)] 98.5 F (36.9 C) (08/20 1358) Pulse Rate:  [71-80] 71 (08/20 1358) Resp:  [18] 18 (08/19 2200) BP: (101-112)/(57-74) 112/74 (08/20 1358) SpO2:  [100 %] 100 % (08/20 1358) Constitutional: He is oriented to person, place, and time. He appears thin, No distress.  HENT: anicteric, pale Mouth/Throat: Oropharynx -no thrush Cardiovascular: Normal rate, regular rhythm and normal heart sounds..  Pulmonary/Chest: bil rhonchi Abdominal: Soft. Bowel sounds are normal. He exhibits no distension. There is no tenderness.  Lymphadenopathy: He has no cervical adenopathy.  Neurological: He is alert and oriented to person, place, and time.  Skin: Skin is warm and dry. No rash noted. No erythema.  Psychiatric: He has a normal mood and affect. His behavior is normal.   Lab Results  Recent Labs  08/04/17 0405 08/05/17 0426  WBC 0.7* 0.8*  HGB 7.3* 6.9*  HCT 22.1* 21.1*  NA 134*  --   K 3.8  --   CL 112*  --   CO2  18*  --   BUN 7  --   CREATININE 0.81  --     Microbiology: Results for orders placed or performed during the hospital encounter of 07/31/17  Blood Culture (routine x 2)     Status: None   Collection Time: 07/31/17 11:22 PM  Result Value Ref Range Status   Specimen Description BLOOD RAC  Final   Special Requests   Final    BOTTLES DRAWN AEROBIC AND ANAEROBIC Blood Culture results may not be optimal due to an excessive volume of blood received in culture bottles   Culture NO GROWTH 5 DAYS  Final   Report Status 08/05/2017 FINAL   Final  Blood Culture (routine x 2)     Status: None   Collection Time: 07/31/17 11:22 PM  Result Value Ref Range Status   Specimen Description BLOOD LAC  Final   Special Requests   Final    BOTTLES DRAWN AEROBIC AND ANAEROBIC Blood Culture adequate volume   Culture NO GROWTH 5 DAYS  Final   Report Status 08/05/2017 FINAL  Final  Culture, sputum-assessment     Status: None   Collection Time: 08/03/17  9:30 AM  Result Value Ref Range Status   Specimen Description EXPECTORATED SPUTUM  Final   Special Requests Immunocompromised  Final   Sputum evaluation   Final    Sputum specimen not acceptable for testing.  Please recollect.   NOTIFIED ALLIE SPEICHER  FOR RECOLLECT ON  08/03/17 AT 1049 QSD    Report Status 08/03/2017 FINAL  Final  C difficile quick screen w PCR reflex     Status: None   Collection Time: 08/03/17 10:00 AM  Result Value Ref Range Status   C Diff antigen NEGATIVE NEGATIVE Final   C Diff toxin NEGATIVE NEGATIVE Final   C Diff interpretation No C. difficile detected.  Final  Gastrointestinal Panel by PCR , Stool     Status: Abnormal   Collection Time: 08/04/17  8:15 AM  Result Value Ref Range Status   Campylobacter species NOT DETECTED NOT DETECTED Final   Plesimonas shigelloides NOT DETECTED NOT DETECTED Final   Salmonella species NOT DETECTED NOT DETECTED Final   Yersinia enterocolitica NOT DETECTED NOT DETECTED Final   Vibrio species NOT DETECTED NOT DETECTED Final   Vibrio cholerae NOT DETECTED NOT DETECTED Final   Enteroaggregative E coli (EAEC) NOT DETECTED NOT DETECTED Final   Enteropathogenic E coli (EPEC) NOT DETECTED NOT DETECTED Final   Enterotoxigenic E coli (ETEC) NOT DETECTED NOT DETECTED Final   Shiga like toxin producing E coli (STEC) NOT DETECTED NOT DETECTED Final   Shigella/Enteroinvasive E coli (EIEC) NOT DETECTED NOT DETECTED Final   Cryptosporidium NOT DETECTED NOT DETECTED Final   Cyclospora cayetanensis NOT DETECTED NOT DETECTED Final    Entamoeba histolytica NOT DETECTED NOT DETECTED Final   Giardia lamblia DETECTED (A) NOT DETECTED Final   Adenovirus F40/41 NOT DETECTED NOT DETECTED Final   Astrovirus NOT DETECTED NOT DETECTED Final   Norovirus GI/GII NOT DETECTED NOT DETECTED Final   Rotavirus A NOT DETECTED NOT DETECTED Final   Sapovirus (I, II, IV, and V) NOT DETECTED NOT DETECTED Final    Studies/Results: No results found.  Assessment/Plan: Jeremiah Reed is a 26 y.o. male with recent admission for  bil pna and newly dxed HIV CD4 43, VL 8 million at last admission   He has had a 20# wt loss, night sweats and now cough. Has diarrhea as well after episodes of constipation. Was  being treated with bactrim but now readmitted with nausea and dry heaves. At last admission sputum AFB neg x 1, QF gold neg, Sputum with routine flora, PCP unable to be done, Crypto ag neg,  Has profound anemia and seen by onc, had neg parvovirus DNA.  He continued to have fevers and night sweats and had some diarrhea. Giardia PCR +. Given his very low CD4 and markedly elevated VL of 8 million this is likely related to the HIV but need to continue to evaluate for opportunistic infections. Disseminated MAI can cause the anemia, fevers, sweats and wt loss with the diarrhea so need to rule that out. Disseminated CMV also possible.  C diff neg, Stool PCR with giardia.  Recommendations He has had 21 days treatment for PCP with bactrim and his CT chest shows improvement so will decrease to once daily  bactrim (may be suppressing bone marrow at higher dose) Pending AFB blood culture and stool cx and Bone marrow bx- to be done tomorrow Pending CMV PCR Pharmacy has been able to obtain biktarvy and I have advised him to take one daily. He is at high risk of continue infection, readmission and IRIS so would keep in hospital pending above results Thank you very much for the consult. Will follow with you.  Mick Sell   08/05/2017, 3:00 PM

## 2017-08-05 NOTE — Progress Notes (Signed)
[facility] [place] NAME: Jeremiah Reed    MR#:  [id number]  DATE OF BIRTH:  Jun 14, 1991  SUBJECTIVE:  CHIEF COMPLAINT:   Chief Complaint  Patient presents with  . Emesis   Febrile in the morning but fever curve improving Diarrhea improved  REVIEW OF SYSTEMS:    Review of Systems  Constitutional: Positive for malaise/fatigue. Negative for chills and fever.  HENT: Negative for sore throat.   Eyes: Negative for blurred vision, double vision and pain.  Respiratory: Positive for cough. Negative for hemoptysis, shortness of breath and wheezing.   Cardiovascular: Negative for chest pain, palpitations, orthopnea and leg swelling.  Gastrointestinal: Positive for diarrhea. Negative for abdominal pain, constipation, heartburn and vomiting.  Genitourinary: Negative for dysuria and hematuria.  Musculoskeletal: Negative for back pain and joint pain.  Skin: Negative for rash.  Neurological: Positive for weakness. Negative for sensory change, speech change, focal weakness and headaches.  Endo/Heme/Allergies: Does not bruise/bleed easily.  Psychiatric/Behavioral: Negative for depression. The patient is not nervous/anxious.     DRUG ALLERGIES:  No Known Allergies  VITALS:  Blood pressure 112/74, pulse 71, temperature 98.5 F (36.9 C), temperature source Oral, resp. rate 18, height _0  (1.702 m), weight 130 lb 6.4 oz (59.1 kg), SpO2 100 %.  PHYSICAL EXAMINATION:   Physical Exam  GENERAL:  26 y.o.-year-old patient lying in the bed with no acute distress.  EYES: Pupils equal, round, reactive to light and accommodation. No scleral icterus. Extraocular muscles intact.  HEENT: Head atraumatic, normocephalic. Oropharynx and nasopharynx clear.  NECK:  Supple, no jugular venous distention. No thyroid enlargement, no tenderness.  LUNGS: Normal breath sounds bilaterally, no wheezing, rales, rhonchi. No use of accessory muscles of respiration.   CARDIOVASCULAR: S1, S2 normal. No murmurs, rubs, or gallops.  ABDOMEN: Soft, nontender, nondistended. Bowel sounds present. No organomegaly or mass.  EXTREMITIES: No cyanosis, clubbing or edema b/l.    NEUROLOGIC: Cranial nerves II through XII are intact. No focal Motor or sensory deficits b/l.   PSYCHIATRIC: The patient is alert and oriented x 3.  SKIN: No obvious rash, lesion, or ulcer.   LABORATORY PANEL:   CBC  Recent Labs Lab 08/05/17 0426  WBC 0.8*  HGB 6.9*  HCT 21.1*  PLT 64*   ------------------------------------------------------------------------------------------------------------------ Chemistries   Recent Labs Lab 08/02/17 0454 08/04/17 0405  NA 131* 134*  K 4.0 3.8  CL 105 112*  CO2 21* 18*  GLUCOSE 95 92  BUN 11 7  CREATININE 0.97 0.81  CALCIUM 8.1* 7.7*  MG  --  1.9  AST 288*  --   ALT 83*  --   ALKPHOS 89  --   BILITOT 0.8  --    ------------------------------------------------------------------------------------------------------------------  Cardiac Enzymes No results for input(s): TROPONINI in the last 168 hours. ------------------------------------------------------------------------------------------------------------------  RADIOLOGY:  No results found.   ASSESSMENT AND PLAN:   This is a 26 y.o. male with a history of HIV/AIDS, anxiety/depression, GERD now being admitted with:  #. Sepsis likely Healthcare Associated Pneumonia - Continue IV antibiotics per ID recommendation - Follow up blood & sputum cultures. Still negative Appreciate ID input. w/u for MAI/CMV underway.  # Giardia diarrhea Started metronidazole. Tinidazole not available.  # Diarrhea Stool for C. difficile negative  #. History of HIV/AIDS Started on Biktarvy 08/02/2017  # Pancytopenia due to HIV Worsening-hemoglobin less than 7 we'll transfuse  Seen by heme   bone marrow biopsy tomorrow canceled due to patient not being  nothing by mouth and not  available staff  All the records are reviewed and case discussed with Care Management/Social Worker Management plans discussed with the patient, family and they are in agreement.  CODE STATUS: FULL CODE  DVT Prophylaxis: SCDs  TOTAL TIME TAKING CARE OF THIS PATIENT: 30 minutes.   POSSIBLE D/C IN 1-2 DAYS, DEPENDING ON CLINICAL CONDITION.  Dustin Flock M.D on 08/05/2017 at 3:28 PM  Between 7am to 6pm - Pager - [phone]  After 6pm go to [email or web address] - password [email or web address] [facility]  Office  [phone]  CC: Primary care physician; Patient, No Pcp Per  Note: This dictation was prepared with Dragon dictation along with smaller phrase technology. Any transcriptional errors that result from this process are unintentional.

## 2017-08-05 NOTE — Progress Notes (Signed)
Jeremiah Reed   DOB:1991-07-20   UN#:[id number]    Subjective: Patient's last temperature was 100.1 yesterday morning. He denies any chills. Denies any nausea vomiting. He denies any diarrhea. Denies any abdominal pain.  ROS: He is walking the bathroom without any significant difficulty. Shortness of breath on exertion.  Objective:  Vitals:   08/05/17 1155 08/05/17 1358  BP: 107/61 112/74  Pulse: 74 71  Resp:    Temp: 98.1 F (36.7 C) 98.5 F (36.9 C)  SpO2: 100% 100%     Intake/Output Summary (Last 24 hours) at 08/05/17 1755 Last data filed at 08/05/17 1700  Gross per 24 hour  Intake          6045.33 ml  Output                0 ml  Net          6045.33 ml    GENERAL: Well-nourished well-developed; Thin built. Alert, no distress and comfortable.   Alone. Patient appears nontoxic.  EYES:Positive for pallor; No icterus..  OROPHARYNX: no thrush or ulceration. NECK: supple, no masses felt LYMPH:  no palpable lymphadenopathy in the cervical, axillary or inguinal regions LUNGS: decreased breath sounds to auscultation at bases and  No wheeze or crackles HEART/CVS: regular rate & rhythm and no murmurs; No lower extremity edema ABDOMEN: abdomen soft, non-tender and normal bowel sounds Musculoskeletal:no cyanosis of digits and no clubbing  PSYCH: alert & oriented x 3 with fluent speech NEURO: no focal motor/sensory deficits SKIN:  no rashes or significant lesions   Labs:  Lab Results  Component Value Date   WBC 0.8 (LL) 08/05/2017   HGB 6.9 (L) 08/05/2017   HCT 21.1 (L) 08/05/2017   MCV 80.6 08/05/2017   PLT 64 (L) 08/05/2017   NEUTROABS 0.4 (L) 08/05/2017    Lab Results  Component Value Date   NA 134 (L) 08/04/2017   K 3.8 08/04/2017   CL 112 (H) 08/04/2017   CO2 18 (L) 08/04/2017    Studies:  No results found.  Assessment & Plan:    26 year old male patient with a diagnosis of AIDS- currently admitted to the hospital for recurrent fevers/worsening  pancytopenia.  # Recurrent fevers/worsening severe pancytopenia- the etiology is unclear. The differential includes opportunistic infections versus medication induced. Malignancy less likely. No obvious etiology noted on the CT scans.  Hold off stimulating growth factors- while awaiting above workup/ plan this patient is not toxic. If hemoglobin drops further would recommend 1 unit of PRBC transfusion.   #  Recommend a bone marrow biopsy- for the evaluation of above. Plan to have biopsy tomorrow. Awaiting cultures on the bone marrow biopsy.   Cammie Sickle, MD 08/05/2017  5:55 PM

## 2017-08-06 ENCOUNTER — Inpatient Hospital Stay: Payer: Self-pay

## 2017-08-06 ENCOUNTER — Other Ambulatory Visit (HOSPITAL_COMMUNITY)
Admission: RE | Admit: 2017-08-06 | Discharge: 2017-08-06 | Disposition: A | Discharge status: Home / Self Care | Payer: MEDICAID | Source: Ambulatory Visit | Attending: Internal Medicine | Admitting: Internal Medicine

## 2017-08-06 ENCOUNTER — Telehealth: Payer: Self-pay | Admitting: Internal Medicine

## 2017-08-06 LAB — CBC
HCT: 25.6 % — ABNORMAL LOW (ref 40.0–52.0)
Hemoglobin: 8.6 g/dL — ABNORMAL LOW (ref 13.0–18.0)
MCH: 27.2 pg (ref 26.0–34.0)
MCHC: 33.5 g/dL (ref 32.0–36.0)
MCV: 81.3 fL (ref 80.0–100.0)
PLATELETS: 73 10*3/uL — AB (ref 150–440)
RBC: 3.15 MIL/uL — ABNORMAL LOW (ref 4.40–5.90)
RDW: 17.2 % — AB (ref 11.5–14.5)
WBC: 1.1 10*3/uL — AB (ref 3.8–10.6)

## 2017-08-06 LAB — BASIC METABOLIC PANEL
ANION GAP: 3 — AB (ref 5–15)
BUN: <5 mg/dL — ABNORMAL LOW (ref 6–20)
CALCIUM: 8.1 mg/dL — AB (ref 8.9–10.3)
CO2: 21 mmol/L — ABNORMAL LOW (ref 22–32)
CREATININE: 0.7 mg/dL (ref 0.61–1.24)
Chloride: 112 mmol/L — ABNORMAL HIGH (ref 101–111)
GLUCOSE: 91 mg/dL (ref 65–99)
Potassium: 3.6 mmol/L (ref 3.5–5.1)
Sodium: 136 mmol/L (ref 135–145)

## 2017-08-06 LAB — TYPE AND SCREEN
ABO/RH(D): O POS
Antibody Screen: NEGATIVE
UNIT DIVISION: 0

## 2017-08-06 LAB — DIFFERENTIAL
BASOS ABS: 0 10*3/uL (ref 0–0.1)
BASOS PCT: 0 %
EOS ABS: 0 10*3/uL (ref 0–0.7)
Eosinophils Relative: 2 %
LYMPHS ABS: 0.2 10*3/uL — AB (ref 1.0–3.6)
Lymphocytes Relative: 14 %
MONO ABS: 0.4 10*3/uL (ref 0.2–1.0)
Monocytes Relative: 32 %
Neutro Abs: 0.5 10*3/uL — ABNORMAL LOW (ref 1.4–6.5)
Neutrophils Relative %: 52 %

## 2017-08-06 LAB — BPAM RBC
BLOOD PRODUCT EXPIRATION DATE: 201808222359
ISSUE DATE / TIME: 201808201148
UNIT TYPE AND RH: 5100

## 2017-08-06 LAB — CMV DNA, QUANTITATIVE, PCR
CMV DNA QUANT: 11340 [IU]/mL
LOG10 CMV QN DNA PL: 4.055 {Log_IU}/mL

## 2017-08-06 LAB — HAPTOGLOBIN: Haptoglobin: <10 mg/dL — ABNORMAL LOW (ref 34–200)

## 2017-08-06 MED ORDER — MIDAZOLAM HCL 2 MG/2ML IJ SOLN
INTRAMUSCULAR | Status: AC | PRN
  Administered 2017-08-06 (×2): 1 mg via INTRAVENOUS

## 2017-08-06 MED ORDER — FENTANYL CITRATE (PF) 100 MCG/2ML IJ SOLN
INTRAMUSCULAR | Status: AC | PRN
  Administered 2017-08-06 (×2): 25 ug via INTRAVENOUS

## 2017-08-06 MED ORDER — MIDAZOLAM HCL 2 MG/2ML IJ SOLN
INTRAMUSCULAR | Status: AC
  Filled 2017-08-06: qty 4

## 2017-08-06 MED ORDER — HEPARIN SOD (PORK) LOCK FLUSH 100 UNIT/ML IV SOLN
INTRAVENOUS | Status: AC
  Filled 2017-08-06: qty 5

## 2017-08-06 MED ORDER — FENTANYL CITRATE (PF) 100 MCG/2ML IJ SOLN
INTRAMUSCULAR | Status: AC
  Filled 2017-08-06: qty 2

## 2017-08-06 NOTE — Progress Notes (Signed)
Contacted by CT regarding precautions to be taken during scan, as pt reports that he has been told that TB has been ruled out.  I contacted Dr Sampson Goon to confirm. Per discussion with him, pt had neg AFB smear during last admission and has not been able to produce sputums in spite of induction (this counts as neg AFB) and pt's pneumonia is improving.  Airborne Isolation Precautions can be discontinued at this time. Called CT to inform of this discussion, as well as pt's nurse on floor to discontinue order.

## 2017-08-06 NOTE — Sedation Documentation (Signed)
Pt alert and oriented. Report called to floor nurse Gardiner Ramus. Pt denies discomfort. VS stable. Returned to inpt room 133

## 2017-08-06 NOTE — Progress Notes (Addendum)
[facility] CLINIC INFECTIOUS DISEASE PROGRESS NOTE Date of Admission:  07/31/2017     ID: Jeremiah Reed is a 26 y.o. male with HIV/AIDS, fevers Active Problems:   HCAP (healthcare-associated pneumonia)   Subjective: Fevers improving, less diarrhea. No HA, sob. Mild cough  ROS  Eleven systems are reviewed and negative except per hpi  Medications:  Antibiotics Given (last 72 hours)    Date/Time Action Medication Dose   08/03/17 1629 Given   sulfamethoxazole-trimethoprim (BACTRIM DS,SEPTRA DS) 800-160 MG per tablet 2 tablet 2 tablet   08/03/17 2200 Given   sulfamethoxazole-trimethoprim (BACTRIM DS,SEPTRA DS) 800-160 MG per tablet 2 tablet 2 tablet   08/04/17 0940 Given   sulfamethoxazole-trimethoprim (BACTRIM DS,SEPTRA DS) 800-160 MG per tablet 2 tablet 2 tablet   08/04/17 0941 Given   levofloxacin (LEVAQUIN) tablet 750 mg 750 mg   08/04/17 1455 Given   sulfamethoxazole-trimethoprim (BACTRIM DS,SEPTRA DS) 800-160 MG per tablet 2 tablet 2 tablet   08/04/17 1455 Given   metroNIDAZOLE (FLAGYL) tablet 500 mg 500 mg   08/04/17 2200 Given   sulfamethoxazole-trimethoprim (BACTRIM DS,SEPTRA DS) 800-160 MG per tablet 2 tablet 2 tablet   08/04/17 2200 Given   metroNIDAZOLE (FLAGYL) tablet 500 mg 500 mg   08/05/17 0533 Given   metroNIDAZOLE (FLAGYL) tablet 500 mg 500 mg   08/05/17 1610 Given   sulfamethoxazole-trimethoprim (BACTRIM DS,SEPTRA DS) 800-160 MG per tablet 2 tablet 2 tablet   08/05/17 0922 Given   levofloxacin (LEVAQUIN) tablet 750 mg 750 mg   08/05/17 1336 Given   metroNIDAZOLE (FLAGYL) tablet 500 mg 500 mg   08/05/17 1523 Given   sulfamethoxazole-trimethoprim (BACTRIM DS,SEPTRA DS) 800-160 MG per tablet 1 tablet 1 tablet   08/05/17 2105 Given   metroNIDAZOLE (FLAGYL) tablet 500 mg 500 mg   08/06/17 0504 Given   metroNIDAZOLE (FLAGYL) tablet 500 mg 500 mg   08/06/17 1255 Given   metroNIDAZOLE (FLAGYL) tablet 500 mg 500 mg     . [START ON 08/15/2017] azithromycin  1,200 mg  Oral Weekly  . fentaNYL      . ferrous sulfate  325 mg Oral BID WC  . heparin lock flush      . levofloxacin  750 mg Oral Daily  . metroNIDAZOLE  500 mg Oral Q8H  . midazolam      . nystatin  5 mL Oral QID  . sulfamethoxazole-trimethoprim  1 tablet Oral Daily    Objective: Vital signs in last 24 hours: Temp:  [97.6 F (36.4 C)-98.6 F (37 C)] 97.6 F (36.4 C) (08/21 1400) Pulse Rate:  [65-76] 76 (08/21 1400) Resp:  [16-18] 17 (08/21 1105) BP: (106-115)/(59-73) 114/59 (08/21 1400) SpO2:  [100 %] 100 % (08/21 1400) Constitutional: He is oriented to person, place, and time. He appears thin, No distress.  HENT: anicteric, pale Mouth/Throat: Oropharynx -no thrush Cardiovascular: Normal rate, regular rhythm and normal heart sounds..  Pulmonary/Chest: bil rhonchi Abdominal: Soft. Bowel sounds are normal. He exhibits no distension. There is no tenderness.  Lymphadenopathy: He has no cervical adenopathy.  Neurological: He is alert and oriented to person, place, and time.  Skin: Skin is warm and dry. No rash noted. No erythema.  Psychiatric: He has a normal mood and affect. His behavior is normal.   Lab Results  Recent Labs  08/04/17 0405 08/05/17 0426 08/06/17 0404  WBC 0.7* 0.8* 1.1*  HGB 7.3* 6.9* 8.6*  HCT 22.1* 21.1* 25.6*  NA 134*  --  136  K 3.8  --  3.6  CL  112*  --  112*  CO2 18*  --  21*  BUN 7  --  <5*  CREATININE 0.81  --  0.70    Microbiology: Results for orders placed or performed during the hospital encounter of 07/31/17  Blood Culture (routine x 2)     Status: None   Collection Time: 07/31/17 11:22 PM  Result Value Ref Range Status   Specimen Description BLOOD RAC  Final   Special Requests   Final    BOTTLES DRAWN AEROBIC AND ANAEROBIC Blood Culture results may not be optimal due to an excessive volume of blood received in culture bottles   Culture NO GROWTH 5 DAYS  Final   Report Status 08/05/2017 FINAL  Final  Blood Culture (routine x 2)     Status:  None   Collection Time: 07/31/17 11:22 PM  Result Value Ref Range Status   Specimen Description BLOOD LAC  Final   Special Requests   Final    BOTTLES DRAWN AEROBIC AND ANAEROBIC Blood Culture adequate volume   Culture NO GROWTH 5 DAYS  Final   Report Status 08/05/2017 FINAL  Final  Culture, sputum-assessment     Status: None   Collection Time: 08/03/17  9:30 AM  Result Value Ref Range Status   Specimen Description EXPECTORATED SPUTUM  Final   Special Requests Immunocompromised  Final   Sputum evaluation   Final    Sputum specimen not acceptable for testing.  Please recollect.   NOTIFIED Jeremiah SPEICHER  FOR RECOLLECT ON  08/03/17 AT 1049 QSD    Report Status 08/03/2017 FINAL  Final  C difficile quick screen w PCR reflex     Status: None   Collection Time: 08/03/17 10:00 AM  Result Value Ref Range Status   C Diff antigen NEGATIVE NEGATIVE Final   C Diff toxin NEGATIVE NEGATIVE Final   C Diff interpretation No C. difficile detected.  Final  Gastrointestinal Panel by PCR , Stool     Status: Abnormal   Collection Time: 08/04/17  8:15 AM  Result Value Ref Range Status   Campylobacter species NOT DETECTED NOT DETECTED Final   Plesimonas shigelloides NOT DETECTED NOT DETECTED Final   Salmonella species NOT DETECTED NOT DETECTED Final   Yersinia enterocolitica NOT DETECTED NOT DETECTED Final   Vibrio species NOT DETECTED NOT DETECTED Final   Vibrio cholerae NOT DETECTED NOT DETECTED Final   Enteroaggregative E coli (EAEC) NOT DETECTED NOT DETECTED Final   Enteropathogenic E coli (EPEC) NOT DETECTED NOT DETECTED Final   Enterotoxigenic E coli (ETEC) NOT DETECTED NOT DETECTED Final   Shiga like toxin producing E coli (STEC) NOT DETECTED NOT DETECTED Final   Shigella/Enteroinvasive E coli (EIEC) NOT DETECTED NOT DETECTED Final   Cryptosporidium NOT DETECTED NOT DETECTED Final   Cyclospora cayetanensis NOT DETECTED NOT DETECTED Final   Entamoeba histolytica NOT DETECTED NOT DETECTED Final    Giardia lamblia DETECTED (A) NOT DETECTED Final   Adenovirus F40/41 NOT DETECTED NOT DETECTED Final   Astrovirus NOT DETECTED NOT DETECTED Final   Norovirus GI/GII NOT DETECTED NOT DETECTED Final   Rotavirus A NOT DETECTED NOT DETECTED Final   Sapovirus (I, II, IV, and V) NOT DETECTED NOT DETECTED Final    Studies/Results: Ct Biopsy  Result Date: 08/06/2017 INDICATION: 26 year old with HIV and pancytopenia. Additional core biopsy has been requested for cultures. EXAM: CT GUIDED BONE MARROW ASPIRATES AND BIOPSY Physician: Rachelle Hora. Lowella Dandy, MD MEDICATIONS: None. ANESTHESIA/SEDATION: Fentanyl 50 mcg IV; Versed 2.0 mg IV  Moderate Sedation Time:  35 minutes The patient was continuously monitored during the procedure by the interventional radiology nurse under my direct supervision. COMPLICATIONS: None immediate. PROCEDURE: The procedure was explained to the patient. The risks and benefits of the procedure were discussed and the patient's questions were addressed. Informed consent was obtained from the patient. The patient was placed prone on CT scan. Images of the pelvis were obtained. The left side of back was prepped and draped in sterile fashion. The skin and left posterior iliac bone were anesthetized with 1% lidocaine. 11 gauge bone needle was directed into the left iliac bone with CT guidance. Three aspirates and three core biopsies performed. One core biopsy was placed in saline as requested for culture. Bandage placed over the puncture site. FINDINGS: Bone needle directed into the left posterior ilium. Again noted is a small sclerotic density the posterior right ilium which probably represents a bone island. IMPRESSION: CT guided bone marrow aspirates and core biopsy. Electronically Signed   By: Richarda Overlie M.D.   On: 08/06/2017 11:41    Assessment/Plan: Jeremiah Reed is a 26 y.o. male with recent admission for  bil pna and newly dxed HIV CD4 43, VL 8 million at last admission   He has had a 20# wt  loss, night sweats and now cough. Has diarrhea as well after episodes of constipation. Was being treated with bactrim but now readmitted with nausea and dry heaves. At last admission sputum AFB neg x 1, QF gold neg, Sputum with routine flora, PCP unable to be done, Crypto ag neg,  Has profound anemia and seen by onc, had neg parvovirus DNA.  He continued to have fevers and night sweats and had some diarrhea. Giardia PCR +. Given his very low CD4 and markedly elevated VL of 8 million this is likely related to the HIV but need to continue to evaluate for opportunistic infections. Disseminated MAI can cause the anemia, fevers, sweats and wt loss with the diarrhea so need to rule that out. C diff neg, Stool PCR with giardia.  CMV PCR + on blood BM BXP done today  Recommendations He is very ill and has high risk of progressive infection and death.  I advised him I would suggest he remain hospitalized until we have the results of his bone marrow bxp.   HIV - cont biktarvy and bactrim for PCP PPX.  Cont weekly azithromycin  - monitor for IRIS  Anemia and fevers- heme onc help appreciated. Parvovirus negative on last admi  MAI likely with his fevers and BM suppression. CMV also possible cause but less likely I think.   Pending AFB blood culture and stool cx and Bone marrow bx-  CMV viremia - denies odynophagia/ bloody diarrhea/abd pain so unclear if has end organ disease at this time.  Needs ophtho eval to eval of retinitis - ordered Given profound cytopenia will hold off on starting  gancicilovir as will likely decrease counts further.   Can dc airborne since TB unlikely. Thank you very much for the consult. Will follow with you.  Mick Sell   08/06/2017, 2:30 PM

## 2017-08-06 NOTE — Progress Notes (Signed)
[place] [facility] [place] NAME: Jeremiah Reed    MR#:  [id number]  DATE OF BIRTH:  [birth year]  SUBJECTIVE:  CHIEF COMPLAINT:   Chief Complaint  Patient presents with  . Emesis   Patient feeling better currently has no complaints  Awaits bone marrow biopsy  REVIEW OF SYSTEMS:    Review of Systems  Constitutional: Positive for malaise/fatigue. Negative for chills and fever.  HENT: Negative for sore throat.   Eyes: Negative for blurred vision, double vision and pain.  Respiratory: Negative for cough, hemoptysis, shortness of breath and wheezing.   Cardiovascular: Negative for chest pain, palpitations, orthopnea and leg swelling.  Gastrointestinal: Negative for abdominal pain, constipation, diarrhea, heartburn and vomiting.  Genitourinary: Negative for dysuria and hematuria.  Musculoskeletal: Negative for back pain and joint pain.  Skin: Negative for rash.  Neurological: Positive for weakness. Negative for sensory change, speech change, focal weakness and headaches.  Endo/Heme/Allergies: Does not bruise/bleed easily.  Psychiatric/Behavioral: Negative for depression. The patient is not nervous/anxious.     DRUG ALLERGIES:  No Known Allergies  VITALS:  Blood pressure 109/62, pulse 65, temperature 97.7 F (36.5 C), temperature source Oral, resp. rate 17, height 5' 7"  (1.702 m), weight 130 lb 6.4 oz (59.1 kg), SpO2 100 %.  PHYSICAL EXAMINATION:   Physical Exam  GENERAL:  26 y.o.-year-old patient lying in the bed with no acute distress.  EYES: Pupils equal, round, reactive to light and accommodation. No scleral icterus. Extraocular muscles intact.  HEENT: Head atraumatic, normocephalic. Oropharynx and nasopharynx clear.  NECK:  Supple, no jugular venous distention. No thyroid enlargement, no tenderness.  LUNGS: Normal breath sounds bilaterally, no wheezing, rales, rhonchi. No use of accessory muscles of respiration.  CARDIOVASCULAR: S1, S2  normal. No murmurs, rubs, or gallops.  ABDOMEN: Soft, nontender, nondistended. Bowel sounds present. No organomegaly or mass.  EXTREMITIES: No cyanosis, clubbing or edema b/l.    NEUROLOGIC: Cranial nerves II through XII are intact. No focal Motor or sensory deficits b/l.   PSYCHIATRIC: The patient is alert and oriented x 3.  SKIN: No obvious rash, lesion, or ulcer.   LABORATORY PANEL:   CBC  Recent Labs Lab 08/06/17 0404  WBC 1.1*  HGB 8.6*  HCT 25.6*  PLT 73*   ------------------------------------------------------------------------------------------------------------------ Chemistries   Recent Labs Lab 08/02/17 0454 08/04/17 0405 08/06/17 0404  NA 131* 134* 136  K 4.0 3.8 3.6  CL 105 112* 112*  CO2 21* 18* 21*  GLUCOSE 95 92 91  BUN 11 7 <5*  CREATININE 0.97 0.81 0.70  CALCIUM 8.1* 7.7* 8.1*  MG  --  1.9  --   AST 288*  --   --   ALT 83*  --   --   ALKPHOS 89  --   --   BILITOT 0.8  --   --    ------------------------------------------------------------------------------------------------------------------  Cardiac Enzymes No results for input(s): TROPONINI in the last 168 hours. ------------------------------------------------------------------------------------------------------------------  RADIOLOGY:  Ct Biopsy  Result Date: 08/06/2017 INDICATION: 26 year old with HIV and pancytopenia. Additional core biopsy has been requested for cultures. EXAM: CT GUIDED BONE MARROW ASPIRATES AND BIOPSY Physician: Stephan Minister. Anselm Pancoast, MD MEDICATIONS: None. ANESTHESIA/SEDATION: Fentanyl 50 mcg IV; Versed 2.0 mg IV Moderate Sedation Time:  35 minutes The patient was continuously monitored during the procedure by the interventional radiology nurse under my direct supervision. COMPLICATIONS: None immediate. PROCEDURE: The procedure was explained to the patient. The risks and benefits of the procedure were discussed and  the patient's questions were addressed. Informed consent was  obtained from the patient. The patient was placed prone on CT scan. Images of the pelvis were obtained. The left side of back was prepped and draped in sterile fashion. The skin and left posterior iliac bone were anesthetized with 1% lidocaine. 11 gauge bone needle was directed into the left iliac bone with CT guidance. Three aspirates and three core biopsies performed. One core biopsy was placed in saline as requested for culture. Bandage placed over the puncture site. FINDINGS: Bone needle directed into the left posterior ilium. Again noted is a small sclerotic density the posterior right ilium which probably represents a bone island. IMPRESSION: CT guided bone marrow aspirates and core biopsy. Electronically Signed   By: Markus Daft M.D.   On: 08/06/2017 11:41     ASSESSMENT AND PLAN:   This is a 26 y.o. male with a history of HIV/AIDS, anxiety/depression, GERD now being admitted with:  #. Sepsis likely Healthcare Associated Pneumonia - Continue Levaquin orally Appreciate ID input. w/u for MAI/CMV underway.  # Giardia diarrhea Continue metronidazole. Tinidazole not available.   #. 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  CODE STATUS: FULL CODE  DVT Prophylaxis: SCDs  TOTAL TIME TAKING CARE OF THIS PATIENT: 30 minutes.   POSSIBLE D/C IN 1-2 DAYS, DEPENDING ON CLINICAL CONDITION.  Dustin Flock M.D on 08/06/2017 at 1:32 PM  Between 7am to 6pm - Pager - [phone]  After 6pm go to [email or web address] - password [email or web address]  Office  [phone]  CC: Primary care physician; Patient, No Pcp Per  Note: This dictation was prepared  with Dragon dictation along with smaller phrase technology. Any transcriptional errors that result from this process are unintentional.

## 2017-08-06 NOTE — Telephone Encounter (Signed)
Spoke to radiology tech re: sending out the bone marrow aspiration for AFB/smear- cultures [orders in epic]

## 2017-08-06 NOTE — Procedures (Addendum)
CT guided bone marrow biopsy.  3 aspirates and 3 cores obtained.  Minimal blood loss and no immediate complication.

## 2017-08-06 NOTE — Consult Note (Signed)
Chief Complaint: Patient was seen in consultation today for a bone marrow biopsy  Referring Physician(s): Brahmanday  Patient Status: [facility] - In-pt  History of Present Illness: Jeremiah Reed is a 26 y.o. male with HIV/AIDS and admitted for fevers and worsening pancytopenia.  A bone marrow biopsy with cultures has been requested as part of the work-up.  Patient is feeling better and no complaints today.  Past Medical History:  Diagnosis Date  . Anxiety   . Depression   . GERD (gastroesophageal reflux disease)   . Heart murmur   . Hepatitis A   . HIV (human immunodeficiency virus infection) ([place])     Past Surgical History:  Procedure Laterality Date  . APPENDECTOMY      Allergies: Patient has no known allergies.  Medications: Prior to Admission medications   Medication Sig Start Date End Date Taking? Authorizing Provider  bictegravir-emtricitabine-tenofovir AF (BIKTARVY) 50-200-25 MG TABS tablet Take 1 tablet by mouth daily. 08/02/17   Leonel Ramsay, MD  famotidine (PEPCID) 20 MG tablet Take 1 tablet (20 mg total) by mouth daily. Patient not taking: Reported on 07/26/2017 05/08/17 05/08/18  Loney Hering, MD  ferrous sulfate 325 (65 FE) MG tablet Take 1 tablet (325 mg total) by mouth 2 (two) times daily with a meal. 07/20/17   Epifanio Lesches, MD  nystatin (MYCOSTATIN) 100000 UNIT/ML suspension Take 5 mLs (500,000 Units total) by mouth 4 (four) times daily. 07/20/17   Epifanio Lesches, MD  ondansetron (ZOFRAN ODT) 4 MG disintegrating tablet Take 1 tablet (4 mg total) by mouth every 8 (eight) hours as needed for nausea or vomiting. 07/26/17   Carrie Mew, MD  senna-docusate (SENOKOT-S) 8.6-50 MG tablet Take 2 tablets by mouth 2 (two) times daily. 06/05/17   Carrie Mew, MD  sulfamethoxazole-trimethoprim (BACTRIM,SEPTRA) 400-80 MG tablet Take 1 tablet by mouth 2 (two) times daily. 07/20/17   Epifanio Lesches, MD  VENTOLIN HFA 108 (90 Base) MCG/ACT  inhaler Inhale 2 puffs into the lungs every 4 (four) hours as needed. 06/15/17   [provider]     History reviewed. No pertinent family history.  Social History   Social History  . Marital status: Single    Spouse name: N/A  . Number of children: N/A  . Years of education: N/A   Social History Main Topics  . Smoking status: Never Smoker  . Smokeless tobacco: Never Used  . Alcohol use No  . Drug use: No  . Sexual activity: Not Asked   Other Topics Concern  . None   Social History Narrative  . None        Review of Systems  Respiratory: Negative for shortness of breath.   Cardiovascular: Negative for chest pain.  Genitourinary: Negative for difficulty urinating.    Vital Signs: BP 113/67   Pulse 72   Temp 98.6 F (37 C) (Oral)   Resp 18   Ht 5' 7" (1.702 m)   Wt 130 lb 6.4 oz (59.1 kg)   SpO2 100%   BMI 20.42 kg/m   Physical Exam  Constitutional: No distress.  HENT:  Mouth/Throat: Oropharynx is clear and moist.  Cardiovascular: Normal rate, regular rhythm and normal heart sounds.  Exam reveals no gallop and no friction rub.   No murmur heard. Pulmonary/Chest: Effort normal and breath sounds normal. No respiratory distress.  Abdominal: Soft. He exhibits no distension. There is no tenderness.    Mallampati Score:  MD Evaluation Airway: WNL Heart: WNL Abdomen: WNL Chest/  Lungs: WNL ASA  Classification: 3 Mallampati/Airway Score: Two  Imaging: Dg Chest 2 View  Result Date: 07/26/2017 CLINICAL DATA:  Fever and tachycardia.  HIV positive. EXAM: CHEST  2 VIEW COMPARISON:  07/19/2017 and 07/16/2017 FINDINGS: The heart size and mediastinal contours are within normal limits. Mild bilateral lower lobe infiltrates show slight improvement since previous studies. No new or worsening areas of pulmonary opacity are seen. No evidence of pleural effusion. IMPRESSION: Mild improvement in bilateral lower lobe infiltrates compared to recent studies. No new  or progressive disease identified. Electronically Signed   By: Earle Gell M.D.   On: 07/26/2017 15:28   Dg Chest 2 View  Result Date: 07/19/2017 CLINICAL DATA:  History of pneumonia.  Shortness of breath . EXAM: CHEST  2 VIEW COMPARISON:  CT chest x-ray 07/16/2017. FINDINGS: Mediastinum and hilar structures normal. Heart size stable. 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 No interim change. No pleural effusion or pneumothorax. No acute bony abnormality. IMPRESSION: Persistent multifocal multinodular infiltrates. No change from prior exam. Electronically Signed   By: Marcello Moores  Register   On: 07/19/2017 08:18   Dg Chest 2 View  Result Date: 07/16/2017 CLINICAL DATA:  On and off right-sided chest pain. EXAM: CHEST  2 VIEW COMPARISON:  05/08/2017 FINDINGS: There are streaky basal opacities bilaterally. Bronchopneumonia or atelectasis can have this appearance. No edema, effusion, or pneumothorax. Normal heart size and mediastinal contours. IMPRESSION: Bilateral bronchopneumonia or atelectasis in the lower lungs. Electronically Signed   By: Monte Fantasia M.D.   On: 07/16/2017 08:09   Ct Chest W Contrast  Result Date: 08/03/2017 CLINICAL DATA:  New diagnosis of HIV, cough and shortness of breath with fever and chills. Right-sided chest pain. Night sweats. Weight loss. EXAM: CT CHEST, ABDOMEN, AND PELVIS WITH CONTRAST TECHNIQUE: Multidetector CT imaging of the chest, abdomen and pelvis was performed following the standard protocol during bolus administration of intravenous contrast. CONTRAST:  189m ISOVUE-300 IOPAMIDOL (ISOVUE-300) INJECTION 61% COMPARISON:  07/16/2017 CT chest. 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 Vascular structures are otherwise unremarkable. Heart size normal. Small amount of pericardial fluid, increased/new from 07/16/2017. Mediastinum/Nodes: No pathologically enlarged mediastinal, hilar or  right axillary lymph nodes. Left axillary lymph nodes are not considered enlarged by CT size criteria. Esophagus is grossly unremarkable. Lungs/Pleura: Interval improvement in patchy areas of ill-defined peribronchovascular nodularity and nodular consolidation, without complete resolution. Scattered volume loss and mild bronchiectasis in the right middle lobe, lingula and left lower lobe. No pleural fluid. Airway is unremarkable. Musculoskeletal: No worrisome lytic or sclerotic lesions. CT ABDOMEN PELVIS FINDINGS Hepatobiliary: Liver and gallbladder are unremarkable. No biliary ductal dilatation. Pancreas: Negative. Spleen: Marked parenchymal heterogeneity, increased from prior. Within normal limits for size, 11.9 cm. Adrenals/Urinary Tract: Adrenal glands and kidneys are unremarkable. Ureters are decompressed. Bladder is grossly unremarkable. Stomach/Bowel: Stomach, small bowel, appendix and colon are unremarkable. Vascular/Lymphatic: Scattered lymph nodes are subcentimeter in short axis size. Gastrohepatic ligament lymph node measures 8 mm. Reproductive: Prostate is normal in size. Other: No free fluid.  Mesenteries and peritoneum are unremarkable. Musculoskeletal: No worrisome lytic or sclerotic lesions. IMPRESSION: 1. Small pericardial effusion, new. 2. Interval improvement in patchy areas of ill-defined peribronchovascular nodularity and nodular consolidation in the lungs, indicative of an infectious bronchiolitis/bronchopneumonia, without complete resolution. 3. Splenic heterogeneity, increased from 07/16/2017, possibly due to phase of contrast enhancement. HIV involvement is not excluded. Electronically Signed   By: MLorin PicketM.D.   On: 08/03/2017  07:31   Ct Angio Chest Pe W And/or Wo Contrast  Result Date: 07/16/2017 CLINICAL DATA:  Chest pain, elevated D-dimer. EXAM: CT ANGIOGRAPHY CHEST WITH CONTRAST TECHNIQUE: Multidetector CT imaging of the chest was performed using the standard protocol during  bolus administration of intravenous contrast. Multiplanar CT image reconstructions and MIPs were obtained to evaluate the vascular anatomy. CONTRAST:  75 cc Isovue 370 IV COMPARISON:  Chest x-ray earlier today FINDINGS: Cardiovascular: No filling defects in the pulmonary arteries to suggest pulmonary emboli. Heart is normal size. Aorta is normal caliber. Mediastinum/Nodes: No mediastinal, hilar, or axillary adenopathy. Trachea and esophagus are unremarkable. Lungs/Pleura: Nodular airspace disease in the right upper lobe, right middle lobe, lingula and left lower lobe most compatible with multifocal pneumonia. No effusions. Upper Abdomen: Imaging into the upper abdomen shows no acute findings. Musculoskeletal: Chest wall soft tissues are unremarkable. No acute bony abnormality. Review of the MIP images confirms the above findings. IMPRESSION: Multifocal nodular airspace disease most compatible with pneumonia. No evidence of pulmonary embolus. Electronically Signed   By: Rolm Baptise M.D.   On: 07/16/2017 11:16   Ct Abdomen Pelvis W Contrast  Result Date: 08/03/2017 CLINICAL DATA:  New diagnosis of HIV, cough and shortness of breath with fever and chills. Right-sided chest pain. Night sweats. Weight loss. EXAM: CT CHEST, ABDOMEN, AND PELVIS WITH CONTRAST TECHNIQUE: Multidetector CT imaging of the chest, abdomen and pelvis was performed following the standard protocol during bolus administration of intravenous contrast. CONTRAST:  168m ISOVUE-300 IOPAMIDOL (ISOVUE-300) INJECTION 61% COMPARISON:  07/16/2017 CT chest. 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 Vascular structures are otherwise unremarkable. Heart size normal. Small amount of pericardial fluid, increased/new from 07/16/2017. Mediastinum/Nodes: No pathologically enlarged mediastinal, hilar or right axillary lymph nodes. Left axillary lymph nodes are not considered enlarged by CT  size criteria. Esophagus is grossly unremarkable. Lungs/Pleura: Interval improvement in patchy areas of ill-defined peribronchovascular nodularity and nodular consolidation, without complete resolution. Scattered volume loss and mild bronchiectasis in the right middle lobe, lingula and left lower lobe. No pleural fluid. Airway is unremarkable. Musculoskeletal: No worrisome lytic or sclerotic lesions. CT ABDOMEN PELVIS FINDINGS Hepatobiliary: Liver and gallbladder are unremarkable. No biliary ductal dilatation. Pancreas: Negative. Spleen: Marked parenchymal heterogeneity, increased from prior. Within normal limits for size, 11.9 cm. Adrenals/Urinary Tract: Adrenal glands and kidneys are unremarkable. Ureters are decompressed. Bladder is grossly unremarkable. Stomach/Bowel: Stomach, small bowel, appendix and colon are unremarkable. Vascular/Lymphatic: Scattered lymph nodes are subcentimeter in short axis size. Gastrohepatic ligament lymph node measures 8 mm. Reproductive: Prostate is normal in size. Other: No free fluid.  Mesenteries and peritoneum are unremarkable. Musculoskeletal: No worrisome lytic or sclerotic lesions. IMPRESSION: 1. Small pericardial effusion, new. 2. Interval improvement in patchy areas of ill-defined peribronchovascular nodularity and nodular consolidation in the lungs, indicative of an infectious bronchiolitis/bronchopneumonia, without complete resolution. 3. Splenic heterogeneity, increased from 07/16/2017, possibly due to phase of contrast enhancement. HIV involvement is not excluded. Electronically Signed   By: MLorin PicketM.D.   On: 08/03/2017 07:31   Dg Abdomen Acute W/chest  Result Date: 07/31/2017 CLINICAL DATA:  Recent gagging episodes EXAM: DG ABDOMEN ACUTE W/ 1V CHEST COMPARISON:  07/26/2017 FINDINGS: Cardiac shadow is stable. Previously seen basilar infiltrates have resolved in the interval. No new focal infiltrate is seen. Scattered large and small bowel gas is noted. No  obstructive changes are seen. No abnormal mass or abnormal calcifications are seen. No acute bony abnormality is  noted. IMPRESSION: No acute abnormality seen. Electronically Signed   By: Inez Catalina M.D.   On: 07/31/2017 21:53   US Abdomen Limited Ruq  Result Date: 07/16/2017 CLINICAL DATA:  Right quadrant pain. EXAM: ULTRASOUND ABDOMEN LIMITED RIGHT UPPER QUADRANT COMPARISON:  06/05/2017. FINDINGS: Gallbladder: No gallstones or wall thickening visualized. No sonographic Murphy sign noted by sonographer. Common bile duct: Diameter: 2.6 mm Liver: No focal lesion identified. Within normal limits in parenchymal echogenicity. IMPRESSION: No acute or focal abnormality identified. Electronically Signed   By: Marcello Moores  Register   On: 07/16/2017 09:21    Labs:  CBC:  Recent Labs  08/02/17 0454 08/04/17 0405 08/05/17 0426 08/06/17 0404  WBC 1.0* 0.7* 0.8* 1.1*  HGB 8.8* 7.3* 6.9* 8.6*  HCT 27.3* 22.1* 21.1* 25.6*  PLT 96* 69* 64* 73*    COAGS:  Recent Labs  07/19/17 0843 07/26/17 1443  INR 1.12 1.01  APTT 47*  --     BMP:  Recent Labs  08/01/17 0336 08/02/17 0454 08/04/17 0405 08/06/17 0404  NA 133* 131* 134* 136  K 4.5 4.0 3.8 3.6  CL 106 105 112* 112*  CO2 23 21* 18* 21*  GLUCOSE 97 95 92 91  BUN _0 <5*  CALCIUM 7.6* 8.1* 7.7* 8.1*  CREATININE 1.06 0.97 0.81 0.70  GFRNONAA >60 >60 >60 >60  GFRAA >60 >60 >60 >60    LIVER FUNCTION TESTS:  Recent Labs  07/26/17 1443 07/31/17 1814 08/01/17 0336 08/02/17 0454  BILITOT 0.6 1.0 0.8 0.8  AST 79* 284* 298* 288*  ALT 40 90* 74* 83*  ALKPHOS 58 82 85 89  PROT 8.0 7.8 5.9* 6.3*  ALBUMIN 3.6 3.6 2.8* 2.7*    TUMOR MARKERS: No results for input(s): AFPTM, CEA, CA199, CHROMGRNA in the last 8760 hours.  Assessment and Plan:  25 yo with HIV and pancytopenia and needs a bone marrow biopsy.  Biopsy explained to patient and plan to use moderate sedation.  Informed consent obtained from the patient.  Plan to get an  additional core biopsy for cultures.  Plan for CT guided bone marrow biopsy with moderate sedation.      Thank you for this interesting consult.  I greatly enjoyed meeting Jeremiah Reed and look forward to participating in their care.  A copy of this report was sent to the requesting provider on this date.  Electronically Signed: Carylon Perches, MD 08/06/2017, 10:23 AM   I spent a total of 20 Minutes   in face to face in clinical consultation, greater than 50% of which was counseling/coordinating care for a bone marrow biopsy.

## 2017-08-07 DIAGNOSIS — Z9889 Other specified postprocedural states: Secondary | ICD-10-CM

## 2017-08-07 LAB — HLA B*5701: HLA B 5701: NEGATIVE

## 2017-08-07 LAB — CBC
HCT: 25.3 % — ABNORMAL LOW (ref 40.0–52.0)
Hemoglobin: 8.4 g/dL — ABNORMAL LOW (ref 13.0–18.0)
MCH: 26.9 pg (ref 26.0–34.0)
MCHC: 33.3 g/dL (ref 32.0–36.0)
MCV: 80.8 fL (ref 80.0–100.0)
PLATELETS: 97 10*3/uL — AB (ref 150–440)
RBC: 3.13 MIL/uL — AB (ref 4.40–5.90)
RDW: 18 % — ABNORMAL HIGH (ref 11.5–14.5)
WBC: 1.3 10*3/uL — AB (ref 3.8–10.6)

## 2017-08-07 LAB — BASIC METABOLIC PANEL
ANION GAP: 3 — AB (ref 5–15)
BUN: 6 mg/dL (ref 6–20)
CO2: 24 mmol/L (ref 22–32)
Calcium: 8.1 mg/dL — ABNORMAL LOW (ref 8.9–10.3)
Chloride: 110 mmol/L (ref 101–111)
Creatinine, Ser: 0.53 mg/dL — ABNORMAL LOW (ref 0.61–1.24)
GFR calc Af Amer: >60 mL/min (ref >60)
Glucose, Bld: 98 mg/dL (ref 65–99)
POTASSIUM: 3.5 mmol/L (ref 3.5–5.1)
SODIUM: 137 mmol/L (ref 135–145)

## 2017-08-07 LAB — ACID FAST SMEAR (AFB): ACID FAST SMEAR - AFSCU2: NEGATIVE

## 2017-08-07 MED ORDER — BICTEGRAVIR-EMTRICITAB-TENOFOV 50-200-25 MG PO TABS
1.0000 | ORAL_TABLET | Freq: Every day | ORAL | Status: DC
  Administered 2017-08-08 – 2017-08-09 (×2): 1 via ORAL
  Filled 2017-08-07: qty 1

## 2017-08-07 NOTE — Progress Notes (Signed)
Patient states he a very small BM, "one little terd", earlier today. Requested Mag Citrate. Medication administered, per patient request. Currently awaiting results. Will continue to monitor.

## 2017-08-07 NOTE — Progress Notes (Signed)
[facility] CLINIC INFECTIOUS DISEASE PROGRESS NOTE Date of Admission:  07/31/2017     ID: Damaso Laday is a 26 y.o. male with HIV/AIDS, fevers Active Problems:   HCAP (healthcare-associated pneumonia)   Subjective: No fevers, no diarrhea, eating.   ROS  Eleven systems are reviewed and negative except per hpi  Medications:  Antibiotics Given (last 72 hours)    Date/Time Action Medication Dose   08/04/17 1455 Given   sulfamethoxazole-trimethoprim (BACTRIM DS,SEPTRA DS) 800-160 MG per tablet 2 tablet 2 tablet   08/04/17 1455 Given   metroNIDAZOLE (FLAGYL) tablet 500 mg 500 mg   08/04/17 2200 Given   sulfamethoxazole-trimethoprim (BACTRIM DS,SEPTRA DS) 800-160 MG per tablet 2 tablet 2 tablet   08/04/17 2200 Given   metroNIDAZOLE (FLAGYL) tablet 500 mg 500 mg   08/05/17 0533 Given   metroNIDAZOLE (FLAGYL) tablet 500 mg 500 mg   08/05/17 4696 Given   sulfamethoxazole-trimethoprim (BACTRIM DS,SEPTRA DS) 800-160 MG per tablet 2 tablet 2 tablet   08/05/17 0922 Given   levofloxacin (LEVAQUIN) tablet 750 mg 750 mg   08/05/17 1336 Given   metroNIDAZOLE (FLAGYL) tablet 500 mg 500 mg   08/05/17 1523 Given   sulfamethoxazole-trimethoprim (BACTRIM DS,SEPTRA DS) 800-160 MG per tablet 1 tablet 1 tablet   08/05/17 2105 Given   metroNIDAZOLE (FLAGYL) tablet 500 mg 500 mg   08/06/17 0504 Given   metroNIDAZOLE (FLAGYL) tablet 500 mg 500 mg   08/06/17 1255 Given   metroNIDAZOLE (FLAGYL) tablet 500 mg 500 mg   08/06/17 2155 Given   metroNIDAZOLE (FLAGYL) tablet 500 mg 500 mg   08/07/17 2952 Given   metroNIDAZOLE (FLAGYL) tablet 500 mg 500 mg   08/07/17 0907 Given   sulfamethoxazole-trimethoprim (BACTRIM DS,SEPTRA DS) 800-160 MG per tablet 1 tablet 1 tablet   08/07/17 1100 Given   levofloxacin (LEVAQUIN) tablet 750 mg 750 mg     . [START ON 08/15/2017] azithromycin  1,200 mg Oral Weekly  . [START ON 08/08/2017] bictegravir-emtricitabine-tenofovir AF  1 tablet Oral Daily  . ferrous sulfate  325  mg Oral BID WC  . levofloxacin  750 mg Oral Daily  . metroNIDAZOLE  500 mg Oral Q8H  . nystatin  5 mL Oral QID  . sulfamethoxazole-trimethoprim  1 tablet Oral Daily    Objective: Vital signs in last 24 hours: Temp:  [97.9 F (36.6 C)] 97.9 F (36.6 C) (08/22 0905) Pulse Rate:  [64] 64 (08/22 0905) Resp:  [16] 16 (08/22 0905) BP: (119)/(72) 119/72 (08/22 0905) SpO2:  [99 %] 99 % (08/22 0905) Constitutional: He is oriented to person, place, and time. He appears thin, No distress.  HENT: anicteric, pale Mouth/Throat: Oropharynx -no thrush Cardiovascular: Normal rate, regular rhythm and normal heart sounds..  Pulmonary/Chest: bil rhonchi Abdominal: Soft. Bowel sounds are normal. He exhibits no distension. There is no tenderness.  Lymphadenopathy: He has no cervical adenopathy.  Neurological: He is alert and oriented to person, place, and time.  Skin: Skin is warm and dry. No rash noted. No erythema.  Psychiatric: He has a normal mood and affect. His behavior is normal.   Lab Results  Recent Labs  08/06/17 0404 08/07/17 0350  WBC 1.1* 1.3*  HGB 8.6* 8.4*  HCT 25.6* 25.3*  NA 136 137  K 3.6 3.5  CL 112* 110  CO2 21* 24  BUN <5* 6  CREATININE 0.70 0.53*    Microbiology: Results for orders placed or performed during the hospital encounter of 07/31/17  Blood Culture (routine x 2)  Status: None   Collection Time: 07/31/17 11:22 PM  Result Value Ref Range Status   Specimen Description BLOOD RAC  Final   Special Requests   Final    BOTTLES DRAWN AEROBIC AND ANAEROBIC Blood Culture results may not be optimal due to an excessive volume of blood received in culture bottles   Culture NO GROWTH 5 DAYS  Final   Report Status 08/05/2017 FINAL  Final  Blood Culture (routine x 2)     Status: None   Collection Time: 07/31/17 11:22 PM  Result Value Ref Range Status   Specimen Description BLOOD LAC  Final   Special Requests   Final    BOTTLES DRAWN AEROBIC AND ANAEROBIC Blood  Culture adequate volume   Culture NO GROWTH 5 DAYS  Final   Report Status 08/05/2017 FINAL  Final  Culture, sputum-assessment     Status: None   Collection Time: 08/03/17  9:30 AM  Result Value Ref Range Status   Specimen Description EXPECTORATED SPUTUM  Final   Special Requests Immunocompromised  Final   Sputum evaluation   Final    Sputum specimen not acceptable for testing.  Please recollect.   NOTIFIED ALLIE SPEICHER  FOR RECOLLECT ON  08/03/17 AT 1049 QSD    Report Status 08/03/2017 FINAL  Final  C difficile quick screen w PCR reflex     Status: None   Collection Time: 08/03/17 10:00 AM  Result Value Ref Range Status   C Diff antigen NEGATIVE NEGATIVE Final   C Diff toxin NEGATIVE NEGATIVE Final   C Diff interpretation No C. difficile detected.  Final  Gastrointestinal Panel by PCR , Stool     Status: Abnormal   Collection Time: 08/04/17  8:15 AM  Result Value Ref Range Status   Campylobacter species NOT DETECTED NOT DETECTED Final   Plesimonas shigelloides NOT DETECTED NOT DETECTED Final   Salmonella species NOT DETECTED NOT DETECTED Final   Yersinia enterocolitica NOT DETECTED NOT DETECTED Final   Vibrio species NOT DETECTED NOT DETECTED Final   Vibrio cholerae NOT DETECTED NOT DETECTED Final   Enteroaggregative E coli (EAEC) NOT DETECTED NOT DETECTED Final   Enteropathogenic E coli (EPEC) NOT DETECTED NOT DETECTED Final   Enterotoxigenic E coli (ETEC) NOT DETECTED NOT DETECTED Final   Shiga like toxin producing E coli (STEC) NOT DETECTED NOT DETECTED Final   Shigella/Enteroinvasive E coli (EIEC) NOT DETECTED NOT DETECTED Final   Cryptosporidium NOT DETECTED NOT DETECTED Final   Cyclospora cayetanensis NOT DETECTED NOT DETECTED Final   Entamoeba histolytica NOT DETECTED NOT DETECTED Final   Giardia lamblia DETECTED (A) NOT DETECTED Final   Adenovirus F40/41 NOT DETECTED NOT DETECTED Final   Astrovirus NOT DETECTED NOT DETECTED Final   Norovirus GI/GII NOT DETECTED NOT  DETECTED Final   Rotavirus A NOT DETECTED NOT DETECTED Final   Sapovirus (I, II, IV, and V) NOT DETECTED NOT DETECTED Final    Studies/Results: Ct Biopsy  Result Date: 08/06/2017 INDICATION: 26 year old with HIV and pancytopenia. Additional core biopsy has been requested for cultures. EXAM: CT GUIDED BONE MARROW ASPIRATES AND BIOPSY Physician: Rachelle Hora. Lowella Dandy, MD MEDICATIONS: None. ANESTHESIA/SEDATION: Fentanyl 50 mcg IV; Versed 2.0 mg IV Moderate Sedation Time:  35 minutes The patient was continuously monitored during the procedure by the interventional radiology nurse under my direct supervision. COMPLICATIONS: None immediate. PROCEDURE: The procedure was explained to the patient. The risks and benefits of the procedure were discussed and the patient's questions were addressed. Informed consent was  obtained from the patient. The patient was placed prone on CT scan. Images of the pelvis were obtained. The left side of back was prepped and draped in sterile fashion. The skin and left posterior iliac bone were anesthetized with 1% lidocaine. 11 gauge bone needle was directed into the left iliac bone with CT guidance. Three aspirates and three core biopsies performed. One core biopsy was placed in saline as requested for culture. Bandage placed over the puncture site. FINDINGS: Bone needle directed into the left posterior ilium. Again noted is a small sclerotic density the posterior right ilium which probably represents a bone island. IMPRESSION: CT guided bone marrow aspirates and core biopsy. Electronically Signed   By: Richarda Overlie M.D.   On: 08/06/2017 11:41    Assessment/Plan: Story Brakeman is a 26 y.o. male with recent admission for bil pna and newly dxed HIV CD4 -43, VL 8 million at last admission   He had a 20# wt loss, night sweats and cough. Has diarrhea as well after episodes of constipation. Was being treated with bactrim but then readmitted with nausea and dry heaves. At last admission sputum AFB  neg x 1, QF gold neg, Sputum with routine flora, PCP unable to be done, Crypto ag neg,  Has profound anemia and seen by onc, had neg parvovirus DNA.  BM BXP done 8/21  Giardia PCR +.C diff neg, CMV PCR + on blood BM BXP done -pending  Given his very low CD4 and markedly elevated VL of 8 million this is likely related to the HIV but need to continue to evaluate for opportunistic infections. Disseminated MAI can cause the anemia, fevers, sweats and wt loss with the diarrhea so need to rule that out.  I did speak with path re prelim findings- nothing obvious but special stains pending. Did not note usual findings of viral effects, mycobacterial effects, or malignancy. Did have marked decrease RBC and precursors. Recommendations He is very ill and has high risk of progressive infection and death.  I advised him I would suggest he remain hospitalized until we have the results of his bone marrow bxp.   HIV - cont biktarvy and bactrim for PCP PPX.  Cont weekly azithromycin  - monitor for IRIS  Anemia and fevers- heme onc help appreciated. Parvovirus negative on last admit but will repeat  MAI possible with his fevers and BM suppression but so far path unrevealing.  CMV also possible cause but less likely I think.   Pending AFB blood culture and stool cx and final Bone marrow bx- DC levofloxacin today  CMV viremia - denies odynophagia/ bloody diarrhea/abd pain so unclear if has end organ disease at this time.  Needs ophtho eval to eval of retinitis - ordered Given profound cytopenia will hold off on starting  gancicilovir as will likely decrease counts further.   Giardia- treat for 7 days with flagyl  Possible Dc this Friday if bm bxp results back Thank you very much for the consult. Will follow with you.  Mick Sell   08/07/2017, 2:23 PM

## 2017-08-07 NOTE — Progress Notes (Signed)
Nutrition Follow-up  DOCUMENTATION CODES:   Severe malnutrition in context of chronic illness  INTERVENTION:  1. Mighty Shake II daily before bed, each supplement provides 480-500 kcals and 20-23 grams of protein  NUTRITION DIAGNOSIS:   Malnutrition related to chronic illness as evidenced by energy intake < 75% for > or equal to 1 month, percent weight loss. -ongoing  GOAL:   Patient will meet greater than or equal to 90% of their needs -progressing  MONITOR:   I & O's, Labs, Skin, PO intake, Weight trends  REASON FOR ASSESSMENT:   Malnutrition Screening Tool    ASSESSMENT:   [name] is a 26 yo male with PMH of GERD, Anxiety, Heaptitis A, recent HIV diagnosis presents with Sepsis secondary to HCAP. He is not currently on ART therapy. Recently been admitted multiple times with PCP Pneumonia. Recently lost 20 pounds per chart. He reports he started losing weight back in March when he lost his appetite.   Jeremiah Reed is feeling better today. PO intake last night was a grilled cheese, a baked potato and brownie. Also states he ate a lot for lunch yesterday. Ate 75% for breakfast this morning. Appetite improving. Awaiting bone marrow biopsy. Need to improve protein intake - mighty shake before bed should help.  Labs reviewed: HGB/HCT 8.4/25.3   Medications reviewed and include:  Iron, NS at 187m/hr   Diet Order:  Diet regular Room service appropriate? Yes; Fluid consistency: Thin  Skin:  Reviewed, no issues  Last BM:  08/05/2017  Height:   Ht Readings from Last 1 Encounters:  08/01/17 5' 7"  (1.702 m)    Weight:   Wt Readings from Last 1 Encounters:  08/01/17 130 lb 6.4 oz (59.1 kg)    Ideal Body Weight:  67.27 kg  BMI:  Body mass index is 20.42 kg/m.  Estimated Nutritional Needs:   Kcal:  29163-8466calories (35-40 cal/kg)  Protein:  89-101 grams (1.5-1.7g/kg)  Fluid:  2.1-2.3L  EDUCATION NEEDS:   No education needs identified at this time  Jeremiah Reed  Jeremiah Tal, MS, RD LDN Inpatient Clinical Dietitian Pager [phone]

## 2017-08-07 NOTE — Progress Notes (Addendum)
[facility] NAME: Jeremiah Reed    MR#:  [id number]  DATE OF BIRTH:  Aug 16, 1991  SUBJECTIVE:  CHIEF COMPLAINT:   Chief Complaint  Patient presents with  . Emesis   No nausea or vomiting. Feels improved. Afebrile today Diarrhea has resolved.  REVIEW OF SYSTEMS:    Review of Systems  Constitutional: Positive for malaise/fatigue. Negative for chills and fever.  HENT: Negative for sore throat.   Eyes: Negative for blurred vision, double vision and pain.  Respiratory: Negative for cough, hemoptysis, shortness of breath and wheezing.   Cardiovascular: Negative for chest pain, palpitations, orthopnea and leg swelling.  Gastrointestinal: Negative for abdominal pain, constipation, diarrhea, heartburn and vomiting.  Genitourinary: Negative for dysuria and hematuria.  Musculoskeletal: Negative for back pain and joint pain.  Skin: Negative for rash.  Neurological: Positive for weakness. Negative for sensory change, speech change, focal weakness and headaches.  Endo/Heme/Allergies: Does not bruise/bleed easily.  Psychiatric/Behavioral: Negative for depression. The patient is not nervous/anxious.     DRUG ALLERGIES:  No Known Allergies  VITALS:  Blood pressure 119/72, pulse 64, temperature 97.9 F (36.6 C), temperature source Oral, resp. rate 16, height 5' 7"  (1.702 m), weight 59.1 kg (130 lb 6.4 oz), SpO2 99 %.  PHYSICAL EXAMINATION:   Physical Exam  GENERAL:  26 y.o.-year-old patient lying in the bed with no acute distress.  EYES: Pupils equal, round, reactive to light and accommodation. No scleral icterus. Extraocular muscles intact.  HEENT: Head atraumatic, normocephalic. Oropharynx and nasopharynx clear.  NECK:  Supple, no jugular venous distention. No thyroid enlargement, no tenderness.  LUNGS: Normal breath sounds bilaterally, no wheezing, rales, rhonchi. No use of accessory muscles of respiration.  CARDIOVASCULAR: S1, S2  normal. No murmurs, rubs, or gallops.  ABDOMEN: Soft, nontender, nondistended. Bowel sounds present. No organomegaly or mass.  EXTREMITIES: No cyanosis, clubbing or edema b/l.    NEUROLOGIC: Cranial nerves II through XII are intact. No focal Motor or sensory deficits b/l.   PSYCHIATRIC: The patient is alert and oriented x 3.  SKIN: No obvious rash, lesion, or ulcer.   LABORATORY PANEL:   CBC  Recent Labs Lab 08/07/17 0350  WBC 1.3*  HGB 8.4*  HCT 25.3*  PLT 97*   ------------------------------------------------------------------------------------------------------------------ Chemistries   Recent Labs Lab 08/02/17 0454 08/04/17 0405  08/07/17 0350  NA 131* 134*  < > 137  K 4.0 3.8  < > 3.5  CL 105 112*  < > 110  CO2 21* 18*  < > 24  GLUCOSE 95 92  < > 98  BUN 11 7  < > 6  CREATININE 0.97 0.81  < > 0.53*  CALCIUM 8.1* 7.7*  < > 8.1*  MG  --  1.9  --   --   AST 288*  --   --   --   ALT 83*  --   --   --   ALKPHOS 89  --   --   --   BILITOT 0.8  --   --   --   < > = values in this interval not displayed. ------------------------------------------------------------------------------------------------------------------  Cardiac Enzymes No results for input(s): TROPONINI in the last 168 hours. ------------------------------------------------------------------------------------------------------------------  RADIOLOGY:  Ct Biopsy  Result Date: 08/06/2017 INDICATION: 26 year old with HIV and pancytopenia. Additional core biopsy has been requested for cultures. EXAM: CT GUIDED BONE MARROW ASPIRATES AND BIOPSY Physician: Stephan Minister. Anselm Pancoast, MD MEDICATIONS: None. ANESTHESIA/SEDATION: Fentanyl 50 mcg IV; Versed  2.0 mg IV Moderate Sedation Time:  35 minutes The patient was continuously monitored during the procedure by the interventional radiology nurse under my direct supervision. COMPLICATIONS: None immediate. PROCEDURE: The procedure was explained to the patient. The risks and  benefits of the procedure were discussed and the patient's questions were addressed. Informed consent was obtained from the patient. The patient was placed prone on CT scan. Images of the pelvis were obtained. The left side of back was prepped and draped in sterile fashion. The skin and left posterior iliac bone were anesthetized with 1% lidocaine. 11 gauge bone needle was directed into the left iliac bone with CT guidance. Three aspirates and three core biopsies performed. One core biopsy was placed in saline as requested for culture. Bandage placed over the puncture site. FINDINGS: Bone needle directed into the left posterior ilium. Again noted is a small sclerotic density the posterior right ilium which probably represents a bone island. IMPRESSION: CT guided bone marrow aspirates and core biopsy. Electronically Signed   By: Markus Daft M.D.   On: 08/06/2017 11:41     ASSESSMENT AND PLAN:   This is a 26 y.o. male with a history of HIV/AIDS, anxiety/depression, GERD now being admitted with:  # CMV viremia. IV ganciclovir once pancytopenia improves  #. Sepsis likely Due to CMV viremia and HIV infection. Not pneumonia. - Continue Levaquin orally Appreciate ID input. w/u for MAI Patient treated for PCP pneumonia and now on prophylactic dose.  # Giardia diarrhea Continue metronidazole.  #. History of HIV/AIDS Started on Biktarvy 08/02/2017 Continue Bactrim   # Pancytopenia due to HIV Status post transfusion Seen by him on current. Bone marrow biopsy done on 08/06/2017. Results pending.  Patient acutely ill and will continue to need inpatient care due to high risk of deterioration.   All the records are reviewed and case discussed with Care Management/Social Worker Management plans discussed with the patient, family and they are in agreement.  CODE STATUS: FULL CODE  DVT Prophylaxis: SCDs  TOTAL TIME TAKING CARE OF THIS PATIENT: 30 minutes.   POSSIBLE D/C IN 1-2 DAYS, DEPENDING ON  CLINICAL CONDITION.  Hillary Bow R M.D on 08/07/2017 at 1:39 PM  Between 7am to 6pm - Pager - [phone]  After 6pm go to [email or web address] - password [email or web address] [facility]  Office  [phone]  CC: Primary care physician; Patient, No Pcp Per  Note: This dictation was prepared with Dragon dictation along with smaller phrase technology. Any transcriptional errors that result from this process are unintentional.

## 2017-08-07 NOTE — Progress Notes (Signed)
Joushua Dugar   DOB:May 24, 1991   IO#:[id number]    Subjective: Patient had a bone marrow biopsy yesterday. He noted to have mild bleeding from the biopsy site. No fevers. He denies any chills. Denies any nausea vomiting. He denies any diarrhea. Denies any abdominal pain.  ROS: Denies any changes in vision.  Objective:  Vitals:   08/07/17 1543 08/07/17 2038  BP: 115/64 114/67  Pulse: 74 70  Resp: 18 18  Temp: 98.6 F (37 C) 98.1 F (36.7 C)  SpO2: 100% 100%    No intake or output data in the 24 hours ending 08/07/17 2215  GENERAL: Well-nourished well-developed; Thin built. Alert, no distress and comfortable.   Alone. Patient appears nontoxic.  EYES:Positive for pallor; No icterus..  OROPHARYNX: no thrush or ulceration. NECK: supple, no masses felt LYMPH:  no palpable lymphadenopathy in the cervical, axillary or inguinal regions LUNGS: decreased breath sounds to auscultation at bases and  No wheeze or crackles HEART/CVS: regular rate & rhythm and no murmurs; No lower extremity edema ABDOMEN: abdomen soft, non-tender and normal bowel sounds Musculoskeletal:no cyanosis of digits and no clubbing  PSYCH: alert & oriented x 3 with fluent speech NEURO: no focal motor/sensory deficits SKIN:  no rashes or significant lesions   Labs:  Lab Results  Component Value Date   WBC 1.3 (LL) 08/07/2017   HGB 8.4 (L) 08/07/2017   HCT 25.3 (L) 08/07/2017   MCV 80.8 08/07/2017   PLT 97 (L) 08/07/2017   NEUTROABS 0.5 (L) 08/06/2017    Lab Results  Component Value Date   NA 137 08/07/2017   K 3.5 08/07/2017   CL 110 08/07/2017   CO2 24 08/07/2017    Studies:  Ct Biopsy  Result Date: 08/06/2017 INDICATION: 26 year old with HIV and pancytopenia. Additional core biopsy has been requested for cultures. EXAM: CT GUIDED BONE MARROW ASPIRATES AND BIOPSY Physician: Stephan Minister. Anselm Pancoast, MD MEDICATIONS: None. ANESTHESIA/SEDATION: Fentanyl 50 mcg IV; Versed 2.0 mg IV Moderate Sedation Time:  35 minutes The  patient was continuously monitored during the procedure by the interventional radiology nurse under my direct supervision. COMPLICATIONS: None immediate. PROCEDURE: The procedure was explained to the patient. The risks and benefits of the procedure were discussed and the patient's questions were addressed. Informed consent was obtained from the patient. The patient was placed prone on CT scan. Images of the pelvis were obtained. The left side of back was prepped and draped in sterile fashion. The skin and left posterior iliac bone were anesthetized with 1% lidocaine. 11 gauge bone needle was directed into the left iliac bone with CT guidance. Three aspirates and three core biopsies performed. One core biopsy was placed in saline as requested for culture. Bandage placed over the puncture site. FINDINGS: Bone needle directed into the left posterior ilium. Again noted is a small sclerotic density the posterior right ilium which probably represents a bone island. IMPRESSION: CT guided bone marrow aspirates and core biopsy. Electronically Signed   By: Markus Daft M.D.   On: 08/06/2017 11:41    Assessment & Plan:    26 year old male patient with a diagnosis of AIDS- currently admitted to the hospital for recurrent fevers/worsening pancytopenia.  # Recurrent fevers/worsening severe pancytopenia- the etiology is unclear. The differential includes opportunistic infections versus medication induced [less likely]. Awaiting bone marrow biopsy results. Cultures-pending. Patient positive for Giardia stool; CMV viremia. ID following.   # Pancytopenia-white count 1.3 hemoglobin 8.4 status post 1 unit PRBC transfusion. Platelets are stable/improved 97.  Lenetta Quaker  Ann Lions, MD 08/07/2017  10:15 PM

## 2017-08-07 NOTE — Consult Note (Signed)
Reason for Consult: CMV Viremia in setting of AIDs, rule out CMV retinitis Referring Physician: Adrian Prows, MD. Chief complaint: fever; no visual complaints  HPI: Jeremiah Reed is an 26 y.o. male with HIV, recent diagnosis of AIDs with high viral load and low CD4 count <50.  He is hospitalized with fevers and is being evaluated for opportunistic infections.  He denies any visual complaints.  No blurry vision, no eye pain, no flashes of light, no floaters.   He wore glasses from 5th - 8th grade but has been doing well since that time without correction. No significant family history of eye disease.  Mom started wearing glasses 38 years old.  Past Medical History:  Diagnosis Date  . Anxiety   . Depression   . GERD (gastroesophageal reflux disease)   . Heart murmur   . Hepatitis A   . HIV (human immunodeficiency virus infection) ([place])     ROS  Past Surgical History:  Procedure Laterality Date  . APPENDECTOMY      History reviewed. No pertinent family history.  Social History:  reports that he has never smoked. He has never used smokeless tobacco. He reports that he does not drink alcohol or use drugs.  Allergies: No Known Allergies  Prior to Admission medications   Medication Sig Start Date End Date Taking? Authorizing Provider  bictegravir-emtricitabine-tenofovir AF (BIKTARVY) 50-200-25 MG TABS tablet Take 1 tablet by mouth daily. 08/02/17   Leonel Ramsay, MD  famotidine (PEPCID) 20 MG tablet Take 1 tablet (20 mg total) by mouth daily. Patient not taking: Reported on 07/26/2017 05/08/17 05/08/18  Loney Hering, MD  ferrous sulfate 325 (65 FE) MG tablet Take 1 tablet (325 mg total) by mouth 2 (two) times daily with a meal. 07/20/17   Epifanio Lesches, MD  nystatin (MYCOSTATIN) 100000 UNIT/ML suspension Take 5 mLs (500,000 Units total) by mouth 4 (four) times daily. 07/20/17   Epifanio Lesches, MD  ondansetron (ZOFRAN ODT) 4 MG disintegrating tablet Take 1 tablet (4  mg total) by mouth every 8 (eight) hours as needed for nausea or vomiting. 07/26/17   Carrie Mew, MD  senna-docusate (SENOKOT-S) 8.6-50 MG tablet Take 2 tablets by mouth 2 (two) times daily. 06/05/17   Carrie Mew, MD  sulfamethoxazole-trimethoprim (BACTRIM,SEPTRA) 400-80 MG tablet Take 1 tablet by mouth 2 (two) times daily. 07/20/17   Epifanio Lesches, MD  VENTOLIN HFA 108 (90 Base) MCG/ACT inhaler Inhale 2 puffs into the lungs every 4 (four) hours as needed. 06/15/17   [provider]    Results for orders placed or performed during the hospital encounter of 07/31/17 (from the past 48 hour(s))  CBC     Status: Abnormal   Collection Time: 08/06/17  4:04 AM  Result Value Ref Range   WBC 1.1 (LL) 3.8 - 10.6 K/uL    Comment: CRITICAL VALUE NOTED.  VALUE IS CONSISTENT WITH PREVIOUSLY REPORTED AND CALLED VALUE.   RBC 3.15 (L) 4.40 - 5.90 MIL/uL   Hemoglobin 8.6 (L) 13.0 - 18.0 g/dL   HCT 25.6 (L) 40.0 - 52.0 %   MCV 81.3 80.0 - 100.0 fL   MCH 27.2 26.0 - 34.0 pg   MCHC 33.5 32.0 - 36.0 g/dL   RDW 17.2 (H) 11.5 - 14.5 %   Platelets 73 (L) 150 - 440 K/uL  Basic metabolic panel     Status: Abnormal   Collection Time: 08/06/17  4:04 AM  Result Value Ref Range   Sodium 136 135 - 145 mmol/L  Potassium 3.6 3.5 - 5.1 mmol/L   Chloride 112 (H) 101 - 111 mmol/L   CO2 21 (L) 22 - 32 mmol/L   Glucose, Bld 91 65 - 99 mg/dL   BUN <5 (L) 6 - 20 mg/dL   Creatinine, Ser 0.70 0.61 - 1.24 mg/dL   Calcium 8.1 (L) 8.9 - 10.3 mg/dL   GFR calc non Af Amer >60 >60 mL/min   GFR calc Af Amer >60 >60 mL/min    Comment: (NOTE) The eGFR has been calculated using the CKD EPI equation. This calculation has not been validated in all clinical situations. eGFR's persistently <60 mL/min signify possible Chronic Kidney Disease.    Anion gap 3 (L) 5 - 15  Differential     Status: Abnormal   Collection Time: 08/06/17  4:04 AM  Result Value Ref Range   Neutrophils Relative % 52 %   Lymphocytes  Relative 14 %   Monocytes Relative 32 %   Eosinophils Relative 2 %   Basophils Relative 0 %   Neutro Abs 0.5 (L) 1.4 - 6.5 K/uL   Lymphs Abs 0.2 (L) 1.0 - 3.6 K/uL   Monocytes Absolute 0.4 0.2 - 1.0 K/uL   Eosinophils Absolute 0.0 0 - 0.7 K/uL   Basophils Absolute 0.0 0 - 0.1 K/uL   Smear Review PENDING BONE MARROW EVALUATION   Acid Fast Smear (AFB)     Status: None   Collection Time: 08/06/17  9:45 AM  Result Value Ref Range   AFB Specimen Processing Concentration    Acid Fast Smear Negative     Comment: (NOTE) Performed At: [facility] [place], [place] [id number] Lindon Romp MD ME:[id number]    Source (AFB) BONE MARROW   CBC     Status: Abnormal   Collection Time: 08/07/17  3:50 AM  Result Value Ref Range   WBC 1.3 (LL) 3.8 - 10.6 K/uL    Comment: CRITICAL VALUE NOTED.  VALUE IS CONSISTENT WITH PREVIOUSLY REPORTED AND CALLED VALUE.   RBC 3.13 (L) 4.40 - 5.90 MIL/uL   Hemoglobin 8.4 (L) 13.0 - 18.0 g/dL   HCT 25.3 (L) 40.0 - 52.0 %   MCV 80.8 80.0 - 100.0 fL   MCH 26.9 26.0 - 34.0 pg   MCHC 33.3 32.0 - 36.0 g/dL   RDW 18.0 (H) 11.5 - 14.5 %   Platelets 97 (L) 150 - 440 K/uL  Basic metabolic panel     Status: Abnormal   Collection Time: 08/07/17  3:50 AM  Result Value Ref Range   Sodium 137 135 - 145 mmol/L   Potassium 3.5 3.5 - 5.1 mmol/L   Chloride 110 101 - 111 mmol/L   CO2 24 22 - 32 mmol/L   Glucose, Bld 98 65 - 99 mg/dL   BUN 6 6 - 20 mg/dL   Creatinine, Ser 0.53 (L) 0.61 - 1.24 mg/dL   Calcium 8.1 (L) 8.9 - 10.3 mg/dL   GFR calc non Af Amer >60 >60 mL/min   GFR calc Af Amer >60 >60 mL/min    Comment: (NOTE) The eGFR has been calculated using the CKD EPI equation. This calculation has not been validated in all clinical situations. eGFR's persistently <60 mL/min signify possible Chronic Kidney Disease.    Anion gap 3 (L) 5 - 15    Ct Biopsy  Result Date: 08/06/2017 INDICATION: 26 year old with HIV and pancytopenia. Additional  core biopsy has been requested for cultures. EXAM: CT GUIDED BONE MARROW ASPIRATES AND  BIOPSY Physician: Stephan Minister. Anselm Pancoast, MD MEDICATIONS: None. ANESTHESIA/SEDATION: Fentanyl 50 mcg IV; Versed 2.0 mg IV Moderate Sedation Time:  35 minutes The patient was continuously monitored during the procedure by the interventional radiology nurse under my direct supervision. COMPLICATIONS: None immediate. PROCEDURE: The procedure was explained to the patient. The risks and benefits of the procedure were discussed and the patient's questions were addressed. Informed consent was obtained from the patient. The patient was placed prone on CT scan. Images of the pelvis were obtained. The left side of back was prepped and draped in sterile fashion. The skin and left posterior iliac bone were anesthetized with 1% lidocaine. 11 gauge bone needle was directed into the left iliac bone with CT guidance. Three aspirates and three core biopsies performed. One core biopsy was placed in saline as requested for culture. Bandage placed over the puncture site. FINDINGS: Bone needle directed into the left posterior ilium. Again noted is a small sclerotic density the posterior right ilium which probably represents a bone island. IMPRESSION: CT guided bone marrow aspirates and core biopsy. Electronically Signed   By: Markus Daft M.D.   On: 08/06/2017 11:41    Blood pressure 115/64, pulse 74, temperature 98.6 F (37 C), temperature source Oral, resp. rate 18, height 5' 7"  (1.702 m), weight 59.1 kg (130 lb 6.4 oz), SpO2 100 %.  Mental status: Alert and Oriented x 4  Visual Acuity:  20/20 OD  20/20 OS near [place]  Pupils:  Equally round/ reactive to light.  No Afferent defect.  Motility:  Full/ orthophoric  Visual Fields:  Full to confrontation  IOP:  Soft by palpation.  External/ Lids/ Lashes:  Normal  Anterior Segment:  Conjunctiva:  Normal  OU  Cornea:  Normal  OU  Anterior Chamber: Normal  OU  Lens:   Normal OU  Posterior Segment:  Dilated OU with 1% Tropicamide and 2.5% Phenylephrine  Discs:   Normal c/d ratio, approx 0.3 OU no pallor, no edema OU  Macula:  Normal,  Vessels/ Periphery: Normal, no hemorrhages.  2 small cotton wool spots in each eye, <1 mm each, about 2-3 mm from the optic nerve nasally.    Assessment/Plan:  1.  AIDs with CMV viremia; ID is deferring ganciclovir or valgancivlovir for now given low blood counts.  No CMV retinitis.  2.  Mild HIV retinopathy.  No specific ocular treatments indicated; just systemic treatment of HIV.  Otherwise good vision, no signs of ocular opportunistic infection.  Recommend outpatient followup 4 months given high risk.  Benay Pillow 08/07/2017, 6:44 PM

## 2017-08-08 MED ORDER — POLYETHYLENE GLYCOL 3350 17 G PO PACK: 17.0000 g | PACK | Freq: Every day | ORAL | Status: DC | PRN

## 2017-08-08 NOTE — Progress Notes (Signed)
[facility] NAME: Jeremiah Reed    MR#:  [id number]  DATE OF BIRTH:  [birth year]  SUBJECTIVE:  CHIEF COMPLAINT:   Chief Complaint  Patient presents with  . Emesis   Some constipation.  REVIEW OF SYSTEMS:    Review of Systems  Constitutional: Positive for malaise/fatigue. Negative for chills and fever.  HENT: Negative for sore throat.   Eyes: Negative for blurred vision, double vision and pain.  Respiratory: Negative for cough, hemoptysis, shortness of breath and wheezing.   Cardiovascular: Negative for chest pain, palpitations, orthopnea and leg swelling.  Gastrointestinal: Negative for abdominal pain, constipation, diarrhea, heartburn and vomiting.  Genitourinary: Negative for dysuria and hematuria.  Musculoskeletal: Negative for back pain and joint pain.  Skin: Negative for rash.  Neurological: Positive for weakness. Negative for sensory change, speech change, focal weakness and headaches.  Endo/Heme/Allergies: Does not bruise/bleed easily.  Psychiatric/Behavioral: Negative for depression. The patient is not nervous/anxious.     DRUG ALLERGIES:  No Known Allergies  VITALS:  Blood pressure 117/67, pulse 73, temperature 98 F (36.7 C), temperature source Oral, resp. rate 18, height _0  (1.702 m), weight 57.8 kg (127 lb 6.4 oz), SpO2 100 %.  PHYSICAL EXAMINATION:   Physical Exam  GENERAL:  26 y.o.-year-old patient lying in the bed with no acute distress.  EYES: Pupils equal, round, reactive to light and accommodation. No scleral icterus. Extraocular muscles intact.  HEENT: Head atraumatic, normocephalic. Oropharynx and nasopharynx clear.  NECK:  Supple, no jugular venous distention. No thyroid enlargement, no tenderness.  LUNGS: Normal breath sounds bilaterally, no wheezing, rales, rhonchi. No use of accessory muscles of respiration.  CARDIOVASCULAR: S1, S2 normal. No murmurs, rubs, or gallops.  ABDOMEN: Soft,  nontender, nondistended. Bowel sounds present. No organomegaly or mass.  EXTREMITIES: No cyanosis, clubbing or edema b/l.    NEUROLOGIC: Cranial nerves II through XII are intact. No focal Motor or sensory deficits b/l.   PSYCHIATRIC: The patient is alert and oriented x 3.  SKIN: No obvious rash, lesion, or ulcer.   LABORATORY PANEL:   CBC  Recent Labs Lab 08/07/17 0350  WBC 1.3*  HGB 8.4*  HCT 25.3*  PLT 97*   ------------------------------------------------------------------------------------------------------------------ Chemistries   Recent Labs Lab 08/02/17 0454 08/04/17 0405  08/07/17 0350  NA 131* 134*  < > 137  K 4.0 3.8  < > 3.5  CL 105 112*  < > 110  CO2 21* 18*  < > 24  GLUCOSE 95 92  < > 98  BUN 11 7  < > 6  CREATININE 0.97 0.81  < > 0.53*  CALCIUM 8.1* 7.7*  < > 8.1*  MG  --  1.9  --   --   AST 288*  --   --   --   ALT 83*  --   --   --   ALKPHOS 89  --   --   --   BILITOT 0.8  --   --   --   < > = values in this interval not displayed. ------------------------------------------------------------------------------------------------------------------  Cardiac Enzymes No results for input(s): TROPONINI in the last 168 hours. ------------------------------------------------------------------------------------------------------------------  RADIOLOGY:  No results found.   ASSESSMENT AND PLAN:   This is a 26 y.o. male with a history of HIV/AIDS, anxiety/depression, GERD now being admitted with:  # CMV viremia. IV ganciclovir once pancytopenia improves  #. Sepsis likely Due to CMV viremia and HIV infection. Not pneumonia. -  Finished Levaquin orally Appreciate ID input. w/u for MAI Patient treated for PCP pneumonia and now on prophylactic dose.  # Giardia diarrhea Continue metronidazole.  #. 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 Bone marrow biopsy done on 08/06/2017. Results pending. Discussed with Dr. Rogue Bussing  All the records are reviewed and case discussed with Care Management/Social Worker Management plans discussed with the patient, family and they are in agreement.  CODE STATUS: FULL CODE  DVT Prophylaxis: SCDs  TOTAL TIME TAKING CARE OF THIS PATIENT: 30 minutes.   Likely d/c in AM  Hillary Bow R M.D on 08/08/2017 at 12:08 PM  Between 7am to 6pm - Pager - [phone]  After 6pm go to [email or web address] - password [email or web address] [facility]  Office  [phone]  CC: Primary care physician; Patient, No Pcp Per  Note: This dictation was prepared with Dragon dictation along with smaller phrase technology. Any transcriptional errors that result from this process are unintentional.

## 2017-08-09 ENCOUNTER — Telehealth: Payer: Self-pay | Admitting: Internal Medicine

## 2017-08-09 LAB — CBC WITH DIFFERENTIAL/PLATELET
BASOS ABS: 0 10*3/uL (ref 0–0.1)
BASOS PCT: 0 %
EOS ABS: 0 10*3/uL (ref 0–0.7)
EOS PCT: 1 %
HCT: 23.6 % — ABNORMAL LOW (ref 40.0–52.0)
HEMOGLOBIN: 7.9 g/dL — AB (ref 13.0–18.0)
LYMPHS ABS: 0.3 10*3/uL — AB (ref 1.0–3.6)
Lymphocytes Relative: 17 %
MCH: 26.7 pg (ref 26.0–34.0)
MCHC: 33.4 g/dL (ref 32.0–36.0)
MCV: 79.9 fL — ABNORMAL LOW (ref 80.0–100.0)
Monocytes Absolute: 0.7 10*3/uL (ref 0.2–1.0)
Monocytes Relative: 37 %
NEUTROS PCT: 45 %
Neutro Abs: 0.8 10*3/uL — ABNORMAL LOW (ref 1.4–6.5)
PLATELETS: 141 10*3/uL — AB (ref 150–440)
RBC: 2.96 MIL/uL — AB (ref 4.40–5.90)
RDW: 18 % — ABNORMAL HIGH (ref 11.5–14.5)
WBC: 1.8 10*3/uL — AB (ref 3.8–10.6)

## 2017-08-09 MED ORDER — BICTEGRAVIR-EMTRICITAB-TENOFOV 50-200-25 MG PO TABS: 1.0000 | ORAL_TABLET | Freq: Every day | ORAL | 0 refills | Status: AC

## 2017-08-09 MED ORDER — AZITHROMYCIN 200 MG/5ML PO SUSR: 1200.0000 mg | ORAL | 0 refills | Status: DC

## 2017-08-09 MED ORDER — AZITHROMYCIN 200 MG/5ML PO SUSR: 600.0000 mg | ORAL | Status: DC

## 2017-08-09 MED ORDER — AZITHROMYCIN 200 MG/5ML PO SUSR: 600.0000 mg | ORAL | 0 refills | Status: AC

## 2017-08-09 MED ORDER — SULFAMETHOXAZOLE-TRIMETHOPRIM 400-80 MG PO TABS: 1.0000 | ORAL_TABLET | Freq: Every day | ORAL | 0 refills | Status: AC

## 2017-08-09 MED ORDER — METRONIDAZOLE 500 MG PO TABS: 500.0000 mg | ORAL_TABLET | Freq: Three times a day (TID) | ORAL | 0 refills | Status: AC

## 2017-08-09 NOTE — Progress Notes (Signed)
Patient being discharged home with mother. Reviewed discharge instructions including meds (last dose taken), scripts, and follow-up appointments.  Mother at bedside during instruction. Allowed time for questions.

## 2017-08-09 NOTE — Progress Notes (Signed)
Jeremiah Reed   DOB:[birth year]   GM#:[id number]    Subjective: Patient had a bone marrow biopsy 2 days ago.  No fevers. He denies any chills. Denies any nausea vomiting. He denies any diarrhea. Denies any abdominal pain.  ROS: Denies any changes in vision. Eager to go home.   Objective:  Vitals:   08/09/17 0815 08/09/17 1510  BP: 117/70 (!) 110/58  Pulse: 79 94  Resp: 18   Temp: 98.2 F (36.8 C) 98.7 F (37.1 C)  SpO2: 99% 100%     Intake/Output Summary (Last 24 hours) at 08/09/17 1754 Last data filed at 08/09/17 1318  Gross per 24 hour  Intake              480 ml  Output                0 ml  Net              480 ml    GENERAL: Well-nourished well-developed; Thin built. Alert, no distress and comfortable.   Alone. Patient appears nontoxic.  EYES:Positive for pallor; No icterus..  OROPHARYNX: no thrush or ulceration. NECK: supple, no masses felt LYMPH:  no palpable lymphadenopathy in the cervical, axillary or inguinal regions LUNGS: decreased breath sounds to auscultation at bases and  No wheeze or crackles HEART/CVS: regular rate & rhythm and no murmurs; No lower extremity edema ABDOMEN: abdomen soft, non-tender and normal bowel sounds Musculoskeletal:no cyanosis of digits and no clubbing  PSYCH: alert & oriented x 3 with fluent speech NEURO: no focal motor/sensory deficits SKIN:  no rashes or significant lesions   Labs:  Lab Results  Component Value Date   WBC 1.8 (L) 08/09/2017   HGB 7.9 (L) 08/09/2017   HCT 23.6 (L) 08/09/2017   MCV 79.9 (L) 08/09/2017   PLT 141 (L) 08/09/2017   NEUTROABS 0.8 (L) 08/09/2017    Lab Results  Component Value Date   NA 137 08/07/2017   K 3.5 08/07/2017   CL 110 08/07/2017   CO2 24 08/07/2017    Studies:  No results found.  Assessment & Plan:    26 year old male patient with a new diagnosis of AIDS- currently admitted to the hospital for recurrent fevers/worsening pancytopenia.  # Recurrent fevers/worsening severe  pancytopenia- Bone marrow Biopsy- NEG for any malignancy; likley sec to HIV/CMV viral process.Smear neg for AFB/fungal. Cultures-pending. Patient positive for Giardia stool; ID following.   # Pancytopenia-white count 1.8 hemoglobin 7.9 today-; platelets- improving- 141. Pt fairly asymptomatic from anemia. Okay to hold off transfusion. Okay to be discharged from hematology stand point. Will follow up with Dr.Rao in 2-3 weeks; labs in 1 week [will arrange in the cancer center]. Discussed with Dr.Fitzgerald/ [facility].   Cammie Sickle, MD 08/09/2017  5:54 PM

## 2017-08-09 NOTE — Progress Notes (Signed)
[facility] INF[facility] PROGRESS NOTE Date of Admission:  07/31/2017     ID: Jeremiah Reed is a 26 y.o. male with HIV/AIDS, fevers Active Problems:   HCAP (healthcare-associated pneumonia)   Subjective: Eating more, no complaints  ROS  Eleven systems are reviewed and negative except per hpi  Medications:  Antibiotics Given (last 72 hours)    Date/Time Action Medication Dose   08/06/17 2155 Given   metroNIDAZOLE (FLAGYL) tablet 500 mg 500 mg   08/07/17 6861 Given   metroNIDAZOLE (FLAGYL) tablet 500 mg 500 mg   08/07/17 6837 Given   sulfamethoxazole-trimethoprim (BACTRIM DS,SEPTRA DS) 800-160 MG per tablet 1 tablet 1 tablet   08/07/17 1100 Given   levofloxacin (LEVAQUIN) tablet 750 mg 750 mg   08/07/17 1444 Given   metroNIDAZOLE (FLAGYL) tablet 500 mg 500 mg   08/07/17 2045 Given   metroNIDAZOLE (FLAGYL) tablet 500 mg 500 mg   08/08/17 2902 Given   metroNIDAZOLE (FLAGYL) tablet 500 mg 500 mg   08/08/17 0910 Given   sulfamethoxazole-trimethoprim (BACTRIM DS,SEPTRA DS) 800-160 MG per tablet 1 tablet 1 tablet   08/08/17 0910 Given   bictegravir-emtricitabine-tenofovir AF (BIKTARVY) 50-200-25 MG per tablet 1 tablet 1 tablet   08/08/17 1612 Given   metroNIDAZOLE (FLAGYL) tablet 500 mg 500 mg   08/08/17 2245 Given   metroNIDAZOLE (FLAGYL) tablet 500 mg 500 mg   08/09/17 1115 Given   metroNIDAZOLE (FLAGYL) tablet 500 mg 500 mg   08/09/17 0918 Given   sulfamethoxazole-trimethoprim (BACTRIM DS,SEPTRA DS) 800-160 MG per tablet 1 tablet 1 tablet   08/09/17 0919 Given   bictegravir-emtricitabine-tenofovir AF (BIKTARVY) 50-200-25 MG per tablet 1 tablet 1 tablet     . [START ON 08/15/2017] azithromycin  1,200 mg Oral Weekly  . bictegravir-emtricitabine-tenofovir AF  1 tablet Oral Daily  . ferrous sulfate  325 mg Oral BID WC  . metroNIDAZOLE  500 mg Oral Q8H  . nystatin  5 mL Oral QID  . sulfamethoxazole-trimethoprim  1 tablet Oral Daily    Objective: Vital signs in  last 24 hours: Temp:  [98.2 F (36.8 C)-98.6 F (37 C)] 98.2 F (36.8 C) (08/24 0815) Pulse Rate:  [79-86] 79 (08/24 0815) Resp:  [17-18] 18 (08/24 0815) BP: (117-120)/(70-73) 117/70 (08/24 0815) SpO2:  [99 %-100 %] 99 % (08/24 0815) Constitutional: He is oriented to person, place, and time. He appears thin, No distress.  HENT: anicteric, pale Mouth/Throat: Oropharynx -no thrush Cardiovascular: Normal rate, regular rhythm and normal heart sounds..  Pulmonary/Chest: bil rhonchi Abdominal: Soft. Bowel sounds are normal. He exhibits no distension. There is no tenderness.  Lymphadenopathy: He has no cervical adenopathy.  Neurological: He is alert and oriented to person, place, and time.  Skin: Skin is warm and dry. No rash noted. No erythema.  Psychiatric: He has a normal mood and affect. His behavior is normal.   Lab Results  Recent Labs  08/07/17 0350 08/09/17 0240  WBC 1.3* 1.8*  HGB 8.4* 7.9*  HCT 25.3* 23.6*  NA 137  --   K 3.5  --   CL 110  --   CO2 24  --   BUN 6  --   CREATININE 0.53*  --     Microbiology: Results for orders placed or performed during the hospital encounter of 07/31/17  Blood Culture (routine x 2)     Status: None   Collection Time: 07/31/17 11:22 PM  Result Value Ref Range Status   Specimen Description BLOOD RAC  Final  Special Requests   Final    BOTTLES DRAWN AEROBIC AND ANAEROBIC Blood Culture results may not be optimal due to an excessive volume of blood received in culture bottles   Culture NO GROWTH 5 DAYS  Final   Report Status 08/05/2017 FINAL  Final  Blood Culture (routine x 2)     Status: None   Collection Time: 07/31/17 11:22 PM  Result Value Ref Range Status   Specimen Description BLOOD LAC  Final   Special Requests   Final    BOTTLES DRAWN AEROBIC AND ANAEROBIC Blood Culture adequate volume   Culture NO GROWTH 5 DAYS  Final   Report Status 08/05/2017 FINAL  Final  Culture, sputum-assessment     Status: None   Collection Time:  08/03/17  9:30 AM  Result Value Ref Range Status   Specimen Description EXPECTORATED SPUTUM  Final   Special Requests Immunocompromised  Final   Sputum evaluation   Final    Sputum specimen not acceptable for testing.  Please recollect.   NOTIFIED ALLIE SPEICHER  FOR RECOLLECT ON  08/03/17 AT 1049 QSD    Report Status 08/03/2017 FINAL  Final  C difficile quick screen w PCR reflex     Status: None   Collection Time: 08/03/17 10:00 AM  Result Value Ref Range Status   C Diff antigen NEGATIVE NEGATIVE Final   C Diff toxin NEGATIVE NEGATIVE Final   C Diff interpretation No C. difficile detected.  Final  Gastrointestinal Panel by PCR , Stool     Status: Abnormal   Collection Time: 08/04/17  8:15 AM  Result Value Ref Range Status   Campylobacter species NOT DETECTED NOT DETECTED Final   Plesimonas shigelloides NOT DETECTED NOT DETECTED Final   Salmonella species NOT DETECTED NOT DETECTED Final   Yersinia enterocolitica NOT DETECTED NOT DETECTED Final   Vibrio species NOT DETECTED NOT DETECTED Final   Vibrio cholerae NOT DETECTED NOT DETECTED Final   Enteroaggregative E coli (EAEC) NOT DETECTED NOT DETECTED Final   Enteropathogenic E coli (EPEC) NOT DETECTED NOT DETECTED Final   Enterotoxigenic E coli (ETEC) NOT DETECTED NOT DETECTED Final   Shiga like toxin producing E coli (STEC) NOT DETECTED NOT DETECTED Final   Shigella/Enteroinvasive E coli (EIEC) NOT DETECTED NOT DETECTED Final   Cryptosporidium NOT DETECTED NOT DETECTED Final   Cyclospora cayetanensis NOT DETECTED NOT DETECTED Final   Entamoeba histolytica NOT DETECTED NOT DETECTED Final   Giardia lamblia DETECTED (A) NOT DETECTED Final   Adenovirus F40/41 NOT DETECTED NOT DETECTED Final   Astrovirus NOT DETECTED NOT DETECTED Final   Norovirus GI/GII NOT DETECTED NOT DETECTED Final   Rotavirus A NOT DETECTED NOT DETECTED Final   Sapovirus (I, II, IV, and V) NOT DETECTED NOT DETECTED Final  Acid Fast Smear (AFB)     Status: None    Collection Time: 08/06/17  9:45 AM  Result Value Ref Range Status   AFB Specimen Processing Concentration  Final   Acid Fast Smear Negative  Final    Comment: (NOTE) Performed At: [facility] [address] [place], [place] [id number] Mila Homer MD WU:[id number]    Source (AFB) BONE MARROW  Final    Studies/Results: No results found.  Assessment/Plan: Rose Hippler is a 26 y.o. male with recent admission for bil pna and newly dxed HIV CD4 -43, VL 8 million at last admission   He had a 20# wt loss, night sweats and cough. Has diarrhea as well after episodes of constipation.  Was being treated with bactrim but then readmitted with nausea and dry heaves. At last admission sputum AFB neg x 1, QF gold neg, Sputum with routine flora, PCP unable to be done, Crypto ag neg,  Has profound anemia and seen by onc, had neg parvovirus DNA.  BM BXP done 8/21  Giardia PCR +.C diff neg, CMV PCR + on blood BM BXP done -pending  Given his very low CD4 and markedly elevated VL of 8 million this is likely related to the HIV but need to continue to evaluate for opportunistic infections. Disseminated MAI can cause the anemia, fevers, sweats and wt loss with the diarrhea so need to rule that out.  I did speak with path re prelim findings- nothing obvious but special stains pending. Did not note usual findings of viral effects, mycobacterial effects, or malignancy. Did have marked decrease RBC and precursors.  Recommendations HIV - cont biktarvy and bactrim for PCP PPX.  Cont weekly azithromycin  - monitor for IRIS  Anemia and fevers- heme onc help appreciated. Parvovirus negative on last admit but pending repeat  MAI possible with his fevers and BM suppression but so far path unrevealing and AFB negative  CMV also possible cause but less likely I think.   Pending AFB blood culture and stool cx and final Bone marrow bx- DC levofloxacin today  CMV viremia - denies odynophagia/ bloody  diarrhea/abd pain so unclear if has end organ disease at this time.  Needs ophtho eval to eval of retinitis - ordered Given profound cytopenia will hold off on starting  gancicilovir as will likely decrease counts further.   Giardia- treat for 7 days with flagyl  Can dc home and I can see in [facility] in 2 weeks Thank you very much for the consult. Will follow with you.  Mick Sell   08/09/2017, 2:50 PM

## 2017-08-09 NOTE — Discharge Instructions (Signed)
Resume diet and activity as before ° ° °

## 2017-08-09 NOTE — Telephone Encounter (Signed)
Cordelia Pen- pt discharged from hospital today; Pt will need Labs-CBC/CMP in 1 week;  Will need follow up with Dr.Rao in 2-3 weeks. Dr.B

## 2017-08-11 LAB — HUMAN PARVOVIRUS DNA DETECTION BY PCR: PARVOVIRUS B19 PCR: NEGATIVE

## 2017-08-12 ENCOUNTER — Telehealth: Payer: Self-pay | Admitting: Oncology

## 2017-08-12 LAB — CSF CULTURE W GRAM STAIN

## 2017-08-12 LAB — CSF CULTURE

## 2017-08-12 LAB — O&P RESULT

## 2017-08-12 LAB — OVA + PARASITE EXAM

## 2017-08-12 NOTE — Telephone Encounter (Signed)
Patient is scheduled for labs on 08/20/17 and follow up with Dr Smith Robert on 08/29/17, per schd msg. Left msg on patient's v/m. Also mailed appointment letter.

## 2017-08-13 NOTE — Discharge Summary (Signed)
[facility] NAME: Jeremiah Reed    MR#:  [id number]  DATE OF BIRTH:  1990/12/29  DATE OF ADMISSION:  07/31/2017 ADMITTING PHYSICIAN: Ubaldo Glassing Hugelmeyer, DO  DATE OF DISCHARGE: 08/09/2017  4:01 PM  PRIMARY CARE PHYSICIAN: Patient, No Pcp Per   ADMISSION DIAGNOSIS:  HIV (human immunodeficiency virus infection) ([place]) [B20] HCAP (healthcare-associated pneumonia) [J18.9] Sepsis, due to unspecified organism ([place]) [A41.9] Leukopenia, unspecified type [D72.819]  DISCHARGE DIAGNOSIS:  Active Problems:   HCAP (healthcare-associated pneumonia)   SECONDARY DIAGNOSIS:   Past Medical History:  Diagnosis Date  . Anxiety   . Depression   . GERD (gastroesophageal reflux disease)   . Heart murmur   . Hepatitis A   . HIV (human immunodeficiency virus infection) ([place])      ADMITTING HISTORY  Patient with recent diagnosis of HIV/PCP pneumonia and Bactrim was admitted due to recurrent fevers in spite of being on antibiotics.  HOSPITAL COURSE:   This is a 26 y.o.malewith a history of HIV/AIDS, anxiety/depression, GERDnow being admitted with:  # CMV viremia. IV ganciclovir once pancytopenia improves  #. Sepsis likely Due to CMV viremia and HIV infection. Not pneumonia. - Finished Levaquin orally For sepsis Appreciate ID input. w/u for MAI Patient treated for PCP pneumonia and now on prophylactic dose. On azithromycin 600 mg weekly  # Giardia diarrhea Continue metronidazole for 1 week  #. History of HIV/AIDS Started on Biktarvy 08/02/2017 Continue Bactrim and azithromycin for prophylaxis.  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 Results showed no malignancy. Discussed with Dr. Rogue Bussing Follow-up as outpatient at the cancer center.  Patient is being discharged home in stable condition being afebrile. On Bactrim, azithromycin and Biktarvy.  Follow-up with infectious  disease in 1 week.  CONSULTS OBTAINED:  Treatment Team:  Leonel Ramsay, MD Cammie Sickle, MD Eulogio Bear, MD  DRUG ALLERGIES:  No Known Allergies  DISCHARGE MEDICATIONS:   Discharge Medication List as of 08/09/2017  3:21 PM    START taking these medications   Details  metroNIDAZOLE (FLAGYL) 500 MG tablet Take 1 tablet (500 mg total) by mouth every 8 (eight) hours., Starting Fri 08/09/2017, Print      CONTINUE these medications which have CHANGED   Details  azithromycin (ZITHROMAX) 200 MG/5ML suspension Take 15 mLs (600 mg total) by mouth once a week., Starting Fri 08/09/2017, Until Sat 09/14/2017, Print    bictegravir-emtricitabine-tenofovir AF (BIKTARVY) 50-200-25 MG TABS tablet Take 1 tablet by mouth daily., Starting Fri 08/09/2017, No Print    sulfamethoxazole-trimethoprim (BACTRIM,SEPTRA) 400-80 MG tablet Take 1 tablet by mouth daily., Starting Fri 08/09/2017, Print      CONTINUE these medications which have NOT CHANGED   Details  ferrous sulfate 325 (65 FE) MG tablet Take 1 tablet (325 mg total) by mouth 2 (two) times daily with a meal., Starting Sat 07/20/2017, Normal    nystatin (MYCOSTATIN) 100000 UNIT/ML suspension Take 5 mLs (500,000 Units total) by mouth 4 (four) times daily., Starting Sat 07/20/2017, Normal    ondansetron (ZOFRAN ODT) 4 MG disintegrating tablet Take 1 tablet (4 mg total) by mouth every 8 (eight) hours as needed for nausea or vomiting., Starting Fri 07/26/2017, Print    senna-docusate (SENOKOT-S) 8.6-50 MG tablet Take 2 tablets by mouth 2 (two) times daily., Starting Wed 06/05/2017, Print    VENTOLIN HFA 108 (90 Base) MCG/ACT inhaler Inhale 2 puffs into the lungs every 4 (four)  hours as needed., Starting Sat 06/15/2017, Historical Med      STOP taking these medications     famotidine (PEPCID) 20 MG tablet         Today   VITAL SIGNS:  Blood pressure (!) 110/58, pulse 94, temperature 98.7 F (37.1 C), temperature source Oral,  resp. rate 18, height 5\' 7"  (1.702 m), weight 57.8 kg (127 lb 6.4 oz), SpO2 100 %.  I/O:  No intake or output data in the 24 hours ending 08/13/17 1407  PHYSICAL EXAMINATION:  Physical Exam  GENERAL:  26 y.o.-year-old patient lying in the bed with no acute distress.  LUNGS: Normal breath sounds bilaterally, no wheezing, rales,rhonchi or crepitation. No use of accessory muscles of respiration.  CARDIOVASCULAR: S1, S2 normal. No murmurs, rubs, or gallops.  ABDOMEN: Soft, non-tender, non-distended. Bowel sounds present. No organomegaly or mass.  NEUROLOGIC: Moves all 4 extremities. PSYCHIATRIC: The patient is alert and oriented x 3.  SKIN: No obvious rash, lesion, or ulcer.   DATA REVIEW:   CBC  Recent Labs Lab 08/09/17 0240  WBC 1.8*  HGB 7.9*  HCT 23.6*  PLT 141*    Chemistries   Recent Labs Lab 08/07/17 0350  NA 137  K 3.5  CL 110  CO2 24  GLUCOSE 98  BUN 6  CREATININE 0.53*  CALCIUM 8.1*    Cardiac Enzymes No results for input(s): TROPONINI in the last 168 hours.  Microbiology Results  Results for orders placed or performed during the hospital encounter of 07/31/17  Blood Culture (routine x 2)     Status: None   Collection Time: 07/31/17 11:22 PM  Result Value Ref Range Status   Specimen Description BLOOD RAC  Final   Special Requests   Final    BOTTLES DRAWN AEROBIC AND ANAEROBIC Blood Culture results may not be optimal due to an excessive volume of blood received in culture bottles   Culture NO GROWTH 5 DAYS  Final   Report Status 08/05/2017 FINAL  Final  Blood Culture (routine x 2)     Status: None   Collection Time: 07/31/17 11:22 PM  Result Value Ref Range Status   Specimen Description BLOOD LAC  Final   Special Requests   Final    BOTTLES DRAWN AEROBIC AND ANAEROBIC Blood Culture adequate volume   Culture NO GROWTH 5 DAYS  Final   Report Status 08/05/2017 FINAL  Final  Culture, sputum-assessment     Status: None   Collection Time: 08/03/17   9:30 AM  Result Value Ref Range Status   Specimen Description EXPECTORATED SPUTUM  Final   Special Requests Immunocompromised  Final   Sputum evaluation   Final    Sputum specimen not acceptable for testing.  Please recollect.   NOTIFIED ALLIE SPEICHER  FOR RECOLLECT ON  08/03/17 AT 1049 QSD    Report Status 08/03/2017 FINAL  Final  C difficile quick screen w PCR reflex     Status: None   Collection Time: 08/03/17 10:00 AM  Result Value Ref Range Status   C Diff antigen NEGATIVE NEGATIVE Final   C Diff toxin NEGATIVE NEGATIVE Final   C Diff interpretation No C. difficile detected.  Final  Gastrointestinal Panel by PCR , Stool     Status: Abnormal   Collection Time: 08/04/17  8:15 AM  Result Value Ref Range Status   Campylobacter species NOT DETECTED NOT DETECTED Final   Plesimonas shigelloides NOT DETECTED NOT DETECTED Final   Salmonella species NOT DETECTED  NOT DETECTED Final   Yersinia enterocolitica NOT DETECTED NOT DETECTED Final   Vibrio species NOT DETECTED NOT DETECTED Final   Vibrio cholerae NOT DETECTED NOT DETECTED Final   Enteroaggregative E coli (EAEC) NOT DETECTED NOT DETECTED Final   Enteropathogenic E coli (EPEC) NOT DETECTED NOT DETECTED Final   Enterotoxigenic E coli (ETEC) NOT DETECTED NOT DETECTED Final   Shiga like toxin producing E coli (STEC) NOT DETECTED NOT DETECTED Final   Shigella/Enteroinvasive E coli (EIEC) NOT DETECTED NOT DETECTED Final   Cryptosporidium NOT DETECTED NOT DETECTED Final   Cyclospora cayetanensis NOT DETECTED NOT DETECTED Final   Entamoeba histolytica NOT DETECTED NOT DETECTED Final   Giardia lamblia DETECTED (A) NOT DETECTED Final   Adenovirus F40/41 NOT DETECTED NOT DETECTED Final   Astrovirus NOT DETECTED NOT DETECTED Final   Norovirus GI/GII NOT DETECTED NOT DETECTED Final   Rotavirus A NOT DETECTED NOT DETECTED Final   Sapovirus (I, II, IV, and V) NOT DETECTED NOT DETECTED Final  OVA + PARASITE EXAM     Status: None   Collection  Time: 08/04/17  8:15 AM  Result Value Ref Range Status   OVA + PARASITE EXAM Final report  Final    Comment: (NOTE) These results were obtained using wet preparation(s) and trichrome stained smear. This test does not include testing for Cryptosporidium parvum, Cyclospora, or Microsporidia. Performed At: [facility] [place], [place] [id number] Lindon Romp MD IO:[id number]    Source of Sample STOOL  Final  Acid Fast Smear (AFB)     Status: None   Collection Time: 08/06/17  9:45 AM  Result Value Ref Range Status   AFB Specimen Processing Concentration  Final   Acid Fast Smear Negative  Final    Comment: (NOTE) Performed At: [facility] [place], [place] [id number] Lindon Romp MD IW:[id number]    Source (AFB) BONE MARROW  Final  CSF culture     Status: None   Collection Time: 08/06/17  9:45 AM  Result Value Ref Range Status   Specimen Description CSF  Final   Special Requests NONE  Final   Gram Stain   Final    SEE SEPARATE REPORT Performed at [facility]    Culture   Final    SEE SEPARATE REPORT Performed at [employer] Performed at [facility], [place] [address], [place], [place] [address]    Report Status 08/12/2017 FINAL  Final    RADIOLOGY:  No results found.  Follow up with PCP in 1 week.  Management plans discussed with the patient, family and they are in agreement.  CODE STATUS:  Code Status History    Date Active Date Inactive Code Status Order ID Comments User Context   08/01/2017 12:52 AM 08/09/2017  7:06 PM Full Code [id number]  HugelmeyerUbaldo Glassing, DO Inpatient   07/16/2017  2:16 PM 07/20/2017  3:21 PM Full Code [id number]  Bettey Costa, MD Inpatient      TOTAL TIME TAKING CARE OF THIS PATIENT ON DAY OF DISCHARGE: more than 30 minutes.   Hillary Bow R M.D on 08/13/2017 at 2:07 PM  Between 7am to 6pm - Pager - [phone]  After 6pm go to [email or web address] - password [email or web address]  [facility]  Office  [phone]  CC: Primary care physician; Patient, No Pcp Per  Note: This dictation was prepared with Dragon dictation along with smaller phrase technology. Any transcriptional errors that result from this process are  unintentional. 

## 2017-08-15 ENCOUNTER — Other Ambulatory Visit: Payer: Self-pay | Admitting: *Deleted

## 2017-08-15 DIAGNOSIS — D61818 Other pancytopenia: Secondary | ICD-10-CM

## 2017-08-16 ENCOUNTER — Emergency Department
Admission: EM | Admit: 2017-08-16 | Discharge: 2017-08-16 | Disposition: A | Discharge status: Home / Self Care | Payer: Self-pay | Attending: Emergency Medicine | Admitting: Emergency Medicine

## 2017-08-16 ENCOUNTER — Emergency Department: Payer: Self-pay

## 2017-08-16 DIAGNOSIS — Z79899 Other long term (current) drug therapy: Secondary | ICD-10-CM | POA: Insufficient documentation

## 2017-08-16 DIAGNOSIS — R1013 Epigastric pain: Secondary | ICD-10-CM | POA: Insufficient documentation

## 2017-08-16 DIAGNOSIS — Z21 Asymptomatic human immunodeficiency virus [HIV] infection status: Secondary | ICD-10-CM | POA: Insufficient documentation

## 2017-08-16 LAB — CBC
HCT: 25.6 % — ABNORMAL LOW (ref 40.0–52.0)
Hemoglobin: 8.5 g/dL — ABNORMAL LOW (ref 13.0–18.0)
MCH: 29.3 pg (ref 26.0–34.0)
MCHC: 33.2 g/dL (ref 32.0–36.0)
MCV: 88.4 fL (ref 80.0–100.0)
PLATELETS: 108 10*3/uL — AB (ref 150–440)
RBC: 2.9 MIL/uL — ABNORMAL LOW (ref 4.40–5.90)
RDW: 21.4 % — AB (ref 11.5–14.5)
WBC: 3.5 10*3/uL — AB (ref 3.8–10.6)

## 2017-08-16 LAB — BASIC METABOLIC PANEL
Anion gap: 7 (ref 5–15)
BUN: 9 mg/dL (ref 6–20)
CO2: 29 mmol/L (ref 22–32)
CREATININE: 0.78 mg/dL (ref 0.61–1.24)
Calcium: 8.2 mg/dL — ABNORMAL LOW (ref 8.9–10.3)
Chloride: 100 mmol/L — ABNORMAL LOW (ref 101–111)
GFR calc Af Amer: >60 mL/min (ref >60)
Glucose, Bld: 106 mg/dL — ABNORMAL HIGH (ref 65–99)
Potassium: 3.9 mmol/L (ref 3.5–5.1)
SODIUM: 136 mmol/L (ref 135–145)

## 2017-08-16 LAB — TROPONIN I: Troponin I: <0.03 ng/mL (ref <0.03)

## 2017-08-16 NOTE — ED Triage Notes (Signed)
Patient c/o central chest pain radiating to abdomen with concurrent symptoms of SOB.

## 2017-08-16 NOTE — ED Provider Notes (Signed)
[facility] Emergency Department Provider Note    First MD Initiated Contact with Patient 08/16/17 0411     (approximate)  I have reviewed the triage vital signs and the nursing notes.   HISTORY  Chief Complaint Chest Pain   HPI Jeremiah Reed is a 26 y.o. male with below list of chronic medical conditions including recently diagnosed HIV presents to the emergency department with "abdominal cramping when I take iron tablets". Patient was noted to be anemic and prescribed iron during his recent hospital admission on 07/31/2017. Patient states that since he began taking the iron tablets he's had abdominal cramping area patient denies any abdominal pain at present no vomiting or diarrhea. Patient denies any constipation. Patient denies any chest pain or shortness of breath or fever.  Past Medical History:  Diagnosis Date  . Anxiety   . Depression   . GERD (gastroesophageal reflux disease)   . Heart murmur   . Hepatitis A   . HIV (human immunodeficiency virus infection) [facility])     Patient Active Problem List   Diagnosis Date Noted  . HCAP (healthcare-associated pneumonia) 07/31/2017  . HIV (human immunodeficiency virus infection) ([facility]) 07/26/2017  . Sepsis ([facility]) 07/16/2017    Past Surgical History:  Procedure Laterality Date  . APPENDECTOMY      Prior to Admission medications   Medication Sig Start Date End Date Taking? Authorizing Provider  azithromycin (ZITHROMAX) 200 MG/5ML suspension Take 15 mLs (600 mg total) by mouth once a week. 08/09/17 09/14/17  Milagros Loll, MD  bictegravir-emtricitabine-tenofovir AF (BIKTARVY) 50-200-25 MG TABS tablet Take 1 tablet by mouth daily. 08/09/17   Milagros Loll, MD  ferrous sulfate 325 (65 FE) MG tablet Take 1 tablet (325 mg total) by mouth 2 (two) times daily with a meal. 07/20/17   Katha Hamming, MD  metroNIDAZOLE (FLAGYL) 500 MG tablet Take 1 tablet (500 mg total) by mouth every 8 (eight) hours. 08/09/17    Milagros Loll, MD  nystatin (MYCOSTATIN) 100000 UNIT/ML suspension Take 5 mLs (500,000 Units total) by mouth 4 (four) times daily. 07/20/17   Katha Hamming, MD  ondansetron (ZOFRAN ODT) 4 MG disintegrating tablet Take 1 tablet (4 mg total) by mouth every 8 (eight) hours as needed for nausea or vomiting. 07/26/17   Sharman Cheek, MD  senna-docusate (SENOKOT-S) 8.6-50 MG tablet Take 2 tablets by mouth 2 (two) times daily. 06/05/17   Sharman Cheek, MD  sulfamethoxazole-trimethoprim (BACTRIM,SEPTRA) 400-80 MG tablet Take 1 tablet by mouth daily. 08/09/17   Milagros Loll, MD  VENTOLIN HFA 108 (90 Base) MCG/ACT inhaler Inhale 2 puffs into the lungs every 4 (four) hours as needed. 06/15/17   [provider]    Allergies No known drug allergies  No family history on file.  Social History Social History  Substance Use Topics  . Smoking status: Never Smoker  . Smokeless tobacco: Never Used  . Alcohol use No    Review of Systems Constitutional: No fever/chills Eyes: No visual changes. ENT: No sore throat. Cardiovascular: Denies chest pain. Respiratory: Denies shortness of breath. Gastrointestinal: Positive for abdominal pain.  No nausea, no vomiting.  No diarrhea.  No constipation. Genitourinary: Negative for dysuria. Musculoskeletal: Negative for neck pain.  Negative for back pain. Integumentary: Negative for rash. Neurological: Negative for headaches, focal weakness or numbness.  ____________________________________________   PHYSICAL EXAM:  VITAL SIGNS: ED Triage Vitals  Enc Vitals Group     BP 08/16/17 0052 129/75     Pulse Rate 08/16/17 0052  94     Resp 08/16/17 0052 18     Temp 08/16/17 0052 98.4 F (36.9 C)     Temp Source 08/16/17 0052 Oral     SpO2 08/16/17 0052 100 %     Weight 08/16/17 0050 64.9 kg (143 lb)     Height 08/16/17 0050 1.702 m (5\' 7" )     Head Circumference --      Peak Flow --      Pain Score 08/16/17 0050 6     Pain Loc --       Pain Edu? --      Excl. in GC? --     Constitutional: Alert and oriented. Well appearing and in no acute distress. Eyes: Conjunctivae are normal.  Head: Atraumatic. Mouth/Throat: Mucous membranes are moist.  Oropharynx non-erythematous. Neck: No stridor.  Cardiovascular: Normal rate, regular rhythm. Good peripheral circulation. Grossly normal heart sounds. Respiratory: Normal respiratory effort.  No retractions. Lungs CTAB. Gastrointestinal: Soft and nontender. No distention.  Musculoskeletal: No lower extremity tenderness nor edema. No gross deformities of extremities. Neurologic:  Normal speech and language. No gross focal neurologic deficits are appreciated.  Skin:  Skin is warm, dry and intact. No rash noted. Psychiatric: Mood and affect are normal. Speech and behavior are normal.  ____________________________________________   LABS (all labs ordered are listed, but only abnormal results are displayed)  Labs Reviewed  BASIC METABOLIC PANEL - Abnormal; Notable for the following:       Result Value   Chloride 100 (*)    Glucose, Bld 106 (*)    Calcium 8.2 (*)    All other components within normal limits  CBC - Abnormal; Notable for the following:    WBC 3.5 (*)    RBC 2.90 (*)    Hemoglobin 8.5 (*)    HCT 25.6 (*)    RDW 21.4 (*)    Platelets 108 (*)    All other components within normal limits  TROPONIN I    RADIOLOGY I, Lomira N Caroline Longie, personally viewed and evaluated these images (plain radiographs) as part of my medical decision making, as well as reviewing the written report by the radiologist.  Dg Chest 2 View  Result Date: 08/16/2017 CLINICAL DATA:  Central chest pain EXAM: CHEST  2 VIEW COMPARISON:  08/02/2017, 07/31/2017 FINDINGS: Increased opacity at the lingula and left base. No pleural effusion. Normal heart size. No pneumothorax. IMPRESSION: Lingular and left lung base opacity may reflect atelectasis or pneumonia. Electronically Signed   By: Jasmine PangKim  Fujinaga  M.D.   On: 08/16/2017 01:11     Procedures   ____________________________________________   INITIAL IMPRESSION / ASSESSMENT AND PLAN / ED COURSE  Pertinent labs & imaging results that were available during my care of the patient were reviewed by me and considered in my medical decision making (see chart for details).  26 year old male presenting with abdominal cramping whenever he takes his iron tablets. Suspect the eye and calluses etiology for the patient's abdominal discomfort laboratory data unremarkable. Chest x-ray with above-stated findings patient has no cough fever or dyspnea and a such suspect atelectasis rather than pneumonia      ____________________________________________  FINAL CLINICAL IMPRESSION(S) / ED DIAGNOSES  Final diagnoses:  Epigastric pain     MEDICATIONS GIVEN DURING THIS VISIT:  Medications - No data to display   NEW OUTPATIENT MEDICATIONS STARTED DURING THIS VISIT:  New Prescriptions   No medications on file    Modified Medications   No medications on  file    Discontinued Medications   No medications on file     Note:  This document was prepared using Dragon voice recognition software and may include unintentional dictation errors.    Darci Current, MD 08/16/17 [phone]

## 2017-08-16 NOTE — ED Notes (Signed)
Pt. Verbalizes understanding of d/c instructions and follow-up. VS stable and pain controlled per pt.  Pt. In NAD at time of d/c and denies further concerns regarding this visit. Pt. Stable at the time of departure from the unit, departing unit by the safest and most appropriate manner per that pt condition and limitations. Pt advised to return to the ED at any time for emergent concerns, or for new/worsening symptoms.   

## 2017-08-20 ENCOUNTER — Inpatient Hospital Stay: Payer: Self-pay | Attending: Oncology

## 2017-08-20 DIAGNOSIS — F418 Other specified anxiety disorders: Secondary | ICD-10-CM | POA: Insufficient documentation

## 2017-08-20 DIAGNOSIS — B20 Human immunodeficiency virus [HIV] disease: Secondary | ICD-10-CM | POA: Insufficient documentation

## 2017-08-20 DIAGNOSIS — K219 Gastro-esophageal reflux disease without esophagitis: Secondary | ICD-10-CM | POA: Insufficient documentation

## 2017-08-20 DIAGNOSIS — B59 Pneumocystosis: Secondary | ICD-10-CM | POA: Insufficient documentation

## 2017-08-20 DIAGNOSIS — Z792 Long term (current) use of antibiotics: Secondary | ICD-10-CM | POA: Insufficient documentation

## 2017-08-20 DIAGNOSIS — Z79899 Other long term (current) drug therapy: Secondary | ICD-10-CM | POA: Insufficient documentation

## 2017-08-20 DIAGNOSIS — D61818 Other pancytopenia: Secondary | ICD-10-CM | POA: Insufficient documentation

## 2017-08-20 LAB — COMPREHENSIVE METABOLIC PANEL
ALBUMIN: 2.7 g/dL — AB (ref 3.5–5.0)
ALT: 16 U/L — AB (ref 17–63)
AST: 24 U/L (ref 15–41)
Alkaline Phosphatase: 72 U/L (ref 38–126)
Anion gap: 5 (ref 5–15)
BUN: 8 mg/dL (ref 6–20)
CHLORIDE: 101 mmol/L (ref 101–111)
CO2: 29 mmol/L (ref 22–32)
CREATININE: 0.65 mg/dL (ref 0.61–1.24)
Calcium: 8.3 mg/dL — ABNORMAL LOW (ref 8.9–10.3)
GFR calc Af Amer: >60 mL/min (ref >60)
GLUCOSE: 102 mg/dL — AB (ref 65–99)
POTASSIUM: 4.2 mmol/L (ref 3.5–5.1)
Sodium: 135 mmol/L (ref 135–145)
Total Bilirubin: 0.4 mg/dL (ref 0.3–1.2)
Total Protein: 5.9 g/dL — ABNORMAL LOW (ref 6.5–8.1)

## 2017-08-20 LAB — CBC WITH DIFFERENTIAL/PLATELET
BASOS ABS: 0 10*3/uL (ref 0–0.1)
BASOS PCT: 0 %
EOS ABS: 0 10*3/uL (ref 0–0.7)
EOS PCT: 0 %
HEMATOCRIT: 27.3 % — AB (ref 40.0–52.0)
Hemoglobin: 9.3 g/dL — ABNORMAL LOW (ref 13.0–18.0)
Lymphocytes Relative: 31 %
Lymphs Abs: 0.8 10*3/uL — ABNORMAL LOW (ref 1.0–3.6)
MCH: 29.7 pg (ref 26.0–34.0)
MCHC: 34.1 g/dL (ref 32.0–36.0)
MCV: 87.3 fL (ref 80.0–100.0)
MONO ABS: 0.5 10*3/uL (ref 0.2–1.0)
Monocytes Relative: 21 %
Neutro Abs: 1.2 10*3/uL — ABNORMAL LOW (ref 1.4–6.5)
Neutrophils Relative %: 48 %
Platelets: 292 10*3/uL (ref 150–440)
RBC: 3.13 MIL/uL — AB (ref 4.40–5.90)
RDW: 24.5 % — ABNORMAL HIGH (ref 11.5–14.5)
WBC: 2.6 10*3/uL — AB (ref 3.8–10.6)

## 2017-08-28 ENCOUNTER — Inpatient Hospital Stay (HOSPITAL_BASED_OUTPATIENT_CLINIC_OR_DEPARTMENT_OTHER): Payer: Self-pay | Admitting: Oncology

## 2017-08-28 VITALS — BP 124/80 | HR 101 | Temp 96.6°F | Resp 18 | Wt 150.0 lb

## 2017-08-28 DIAGNOSIS — B59 Pneumocystosis: Secondary | ICD-10-CM

## 2017-08-28 DIAGNOSIS — F418 Other specified anxiety disorders: Secondary | ICD-10-CM

## 2017-08-28 DIAGNOSIS — D61818 Other pancytopenia: Secondary | ICD-10-CM

## 2017-08-28 DIAGNOSIS — Z79899 Other long term (current) drug therapy: Secondary | ICD-10-CM

## 2017-08-28 DIAGNOSIS — Z792 Long term (current) use of antibiotics: Secondary | ICD-10-CM

## 2017-08-28 DIAGNOSIS — B20 Human immunodeficiency virus [HIV] disease: Secondary | ICD-10-CM

## 2017-08-28 DIAGNOSIS — K219 Gastro-esophageal reflux disease without esophagitis: Secondary | ICD-10-CM

## 2017-08-28 NOTE — Progress Notes (Signed)
Hematology/Oncology Consult note [facility]  Telephone:([phone] [phone]  Patient Care Team: Patient, No Pcp Per as PCP - General (General Practice)   Name of the patient: Jeremiah Reed  [id number]  August 13, 1991   Date of visit: 08/28/17  Diagnosis- anemia/ leukpenia secondary to acute HIV infection  Chief complaint/ Reason for visit- post hospital discharge f/u  Heme/Onc history: patient is a 26 year old male who was admitted to the hospital with 4 week history of shortness of breath cough and fever with chills. He was treated with azithromycin a couple of months ago. On admission patient was found to have a white count of 3. CT scan of the chest showed multifocal pneumonia. No evidence of mediastinal hilar or axillary adenopathy. He was tested positive for HIV. He has already been seen by infectious disease who plans to start him on antiretroviral therapy soon after his acute episode of pneumonia improves He does have a history of ongoing night sweats and 20 pound weight loss. He is bisexual but does not know if any of his sex partners have HIV. We have been consult for his anemia.  On 8/10 he was sent to the ED for tachycardia and fever and was found to have a lactic acid of 2.0. He was given levaquin, nystatin, bactrim and discharged. On 07/31/17, he returned to the ED for emesis x3 days. He was found to be septic r/t HCAP. He was admitted and started on Bactrim, levaquin, and azithromycin. Infectious disease was consulted and he started biktarvy for HIV. On 8/20, his hemoglobin was 6.9 and he was given 1 unit of packed rbcs.   On 07/26/17 his white count was 1.5, H&H 7.8/23.6 and a platelet count of 214. On 07/16/2017 his white count was 3.6 and on 07/17/2017 his white count was 4.3. Patient had a white count of 1.9 in May 2018 which eventually recovered between 3-4 the following couple of months. Ferritin levels were elevated at 2305. Iron studies  showed low iron saturation of 7%. B12 was normal and Folate was normal at 22. Hepatitis A antibody positive. No evidence of hep B infection. Testing for hep C was negative. Reticulocyte count low at 0.7% indicative of hypoproliferative anemia.   Results of bloodwork from 07/19/2017 were as follows: Peripheral flow cytometry showed no significant immunophenotyping abnormality. Haptoglobin and serum copper was within normal limits. Parvovirus B19 PCR was negative.   Bone marrow biopsy was performed on 08/06/17. Negative for malignancy. Smear negative for AFB/fungal. Cultures pending.   Interval history- he has started going to the HIV clinic and no medications were added to his regimen so he continues the [place] as monotherapy. He will return there in 1 month. He feels well today and denies fever, new fatigue, or cough. Denies bumps or swollen nodes. He has begun checking his temperature at home and has not had fever. He continues azithromycin once a week and bactrim. He is happy about having gained back some of the weight he had lost and feels his energy is good. Says he is coping well and feels optimistic.   ECOG PS- 1 Pain scale- 0   Review of systems- Review of Systems  Constitutional: Negative for chills, fever, malaise/fatigue and weight loss.  HENT: Negative for congestion, ear discharge, nosebleeds and sore throat.   Eyes: Negative for blurred vision.  Respiratory: Negative for cough, hemoptysis, sputum production, shortness of breath and wheezing.   Cardiovascular: Negative for chest pain, palpitations, orthopnea and claudication.  Gastrointestinal: Negative for abdominal pain, blood in stool, constipation, diarrhea, heartburn, melena, nausea and vomiting.  Genitourinary: Negative for dysuria, flank pain, frequency, hematuria and urgency.  Musculoskeletal: Negative for back pain, joint pain and myalgias.  Skin: Negative for rash.  Neurological: Negative for dizziness, tingling, focal  weakness, seizures, weakness and headaches.  Endo/Heme/Allergies: Does not bruise/bleed easily.  Psychiatric/Behavioral: Negative for depression and suicidal ideas. The patient is nervous/anxious. The patient does not have insomnia.     No Known Allergies  Past Medical History:  Diagnosis Date  . Anxiety   . Depression   . GERD (gastroesophageal reflux disease)   . Heart murmur   . Hepatitis A   . HIV (human immunodeficiency virus infection) ([place])     Past Surgical History:  Procedure Laterality Date  . APPENDECTOMY     Social History   Social History  . Marital status: Single    Spouse name: N/A  . Number of children: N/A  . Years of education: N/A   Occupational History  . Not on file.   Social History Main Topics  . Smoking status: Never Smoker  . Smokeless tobacco: Never Used  . Alcohol use No  . Drug use: No  . Sexual activity: Not on file   Other Topics Concern  . Not on file   Social History Narrative  . No narrative on file    No family history on file.   Current Outpatient Prescriptions:  .  azithromycin (ZITHROMAX) 200 MG/5ML suspension, Take 15 mLs (600 mg total) by mouth once a week., Disp: 90 mL, Rfl: 0 .  bictegravir-emtricitabine-tenofovir AF (BIKTARVY) 50-200-25 MG TABS tablet, Take 1 tablet by mouth daily., Disp: 30 tablet, Rfl: 0 .  ferrous sulfate 325 (65 FE) MG tablet, Take 1 tablet (325 mg total) by mouth 2 (two) times daily with a meal., Disp: 30 tablet, Rfl: 3 .  sulfamethoxazole-trimethoprim (BACTRIM,SEPTRA) 400-80 MG tablet, Take 1 tablet by mouth daily., Disp: 30 tablet, Rfl: 0 .  metroNIDAZOLE (FLAGYL) 500 MG tablet, Take 1 tablet (500 mg total) by mouth every 8 (eight) hours. (Patient not taking: Reported on 08/28/2017), Disp: 9 tablet, Rfl: 0 .  nystatin (MYCOSTATIN) 100000 UNIT/ML suspension, Take 5 mLs (500,000 Units total) by mouth 4 (four) times daily. (Patient not taking: Reported on 08/28/2017), Disp: 200 mL, Rfl: 0 .   ondansetron (ZOFRAN ODT) 4 MG disintegrating tablet, Take 1 tablet (4 mg total) by mouth every 8 (eight) hours as needed for nausea or vomiting. (Patient not taking: Reported on 08/28/2017), Disp: 20 tablet, Rfl: 0 .  senna-docusate (SENOKOT-S) 8.6-50 MG tablet, Take 2 tablets by mouth 2 (two) times daily. (Patient not taking: Reported on 08/28/2017), Disp: 120 tablet, Rfl: 0 .  VENTOLIN HFA 108 (90 Base) MCG/ACT inhaler, Inhale 2 puffs into the lungs every 4 (four) hours as needed., Disp: , Rfl: 1  Physical exam:  Vitals:   08/28/17 1034  BP: 124/80  Pulse: (!) 101  Resp: 18  Temp: (!) 96.6 F (35.9 C)  TempSrc: Tympanic  Weight: 150 lb (68 kg)   Physical Exam  Constitutional: He is oriented to person, place, and time and well-developed, well-nourished, and in no distress.  HENT:  Head: Normocephalic and atraumatic.  Mouth/Throat: No oral lesions.  Eyes: Pupils are equal, round, and reactive to light. EOM are normal.  Neck: Normal range of motion.  Cardiovascular: Regular rhythm and normal heart sounds.   Tachycardic. Rate regular  Pulmonary/Chest: Effort normal and breath sounds normal.  Abdominal: Soft. Bowel sounds are normal.  Musculoskeletal: Normal range of motion. He exhibits no edema.  Lymphadenopathy:    He has no cervical adenopathy.  Neurological: He is alert and oriented to person, place, and time.  Skin: Skin is warm and dry. No lesion and no rash noted. No pallor.  No rash  Psychiatric: Affect and judgment normal. His mood appears anxious. He does not exhibit a depressed mood.     CMP Latest Ref Rng & Units 08/20/2017  Glucose 65 - 99 mg/dL 102(H)  BUN 6 - 20 mg/dL 8  Creatinine 0.61 - 1.24 mg/dL 0.65  Sodium 135 - 145 mmol/L 135  Potassium 3.5 - 5.1 mmol/L 4.2  Chloride 101 - 111 mmol/L 101  CO2 22 - 32 mmol/L 29  Calcium 8.9 - 10.3 mg/dL 8.3(L)  Total Protein 6.5 - 8.1 g/dL 5.9(L)  Total Bilirubin 0.3 - 1.2 mg/dL 0.4  Alkaline Phos 38 - 126 U/L 72  AST 15 -  41 U/L 24  ALT 17 - 63 U/L 16(L)   CBC Latest Ref Rng & Units 08/20/2017  WBC 3.8 - 10.6 K/uL 2.6(L)  Hemoglobin 13.0 - 18.0 g/dL 9.3(L)  Hematocrit 40.0 - 52.0 % 27.3(L)  Platelets 150 - 440 K/uL 292    No images are attached to the encounter.  Dg Chest 2 View  Result Date: 08/16/2017 CLINICAL DATA:  Central chest pain EXAM: CHEST  2 VIEW COMPARISON:  08/02/2017, 07/31/2017 FINDINGS: Increased opacity at the lingula and left base. No pleural effusion. Normal heart size. No pneumothorax. IMPRESSION: Lingular and left lung base opacity may reflect atelectasis or pneumonia. Electronically Signed   By: Donavan Foil M.D.   On: 08/16/2017 01:11   Ct Chest W Contrast  Result Date: 08/03/2017 CLINICAL DATA:  New diagnosis of HIV, cough and shortness of breath with fever and chills. Right-sided chest pain. Night sweats. Weight loss. EXAM: CT CHEST, ABDOMEN, AND PELVIS WITH CONTRAST TECHNIQUE: Multidetector CT imaging of the chest, abdomen and pelvis was performed following the standard protocol during bolus administration of intravenous contrast. CONTRAST:  127m ISOVUE-300 IOPAMIDOL (ISOVUE-300) INJECTION 61% COMPARISON:  07/16/2017 CT chest. 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 Vascular structures are otherwise unremarkable. Heart size normal. Small amount of pericardial fluid, increased/new from 07/16/2017. Mediastinum/Nodes: No pathologically enlarged mediastinal, hilar or right axillary lymph nodes. Left axillary lymph nodes are not considered enlarged by CT size criteria. Esophagus is grossly unremarkable. Lungs/Pleura: Interval improvement in patchy areas of ill-defined peribronchovascular nodularity and nodular consolidation, without complete resolution. Scattered volume loss and mild bronchiectasis in the right middle lobe, lingula and left lower lobe. No pleural fluid. Airway is unremarkable. Musculoskeletal: No  worrisome lytic or sclerotic lesions. CT ABDOMEN PELVIS FINDINGS Hepatobiliary: Liver and gallbladder are unremarkable. No biliary ductal dilatation. Pancreas: Negative. Spleen: Marked parenchymal heterogeneity, increased from prior. Within normal limits for size, 11.9 cm. Adrenals/Urinary Tract: Adrenal glands and kidneys are unremarkable. Ureters are decompressed. Bladder is grossly unremarkable. Stomach/Bowel: Stomach, small bowel, appendix and colon are unremarkable. Vascular/Lymphatic: Scattered lymph nodes are subcentimeter in short axis size. Gastrohepatic ligament lymph node measures 8 mm. Reproductive: Prostate is normal in size. Other: No free fluid.  Mesenteries and peritoneum are unremarkable. Musculoskeletal: No worrisome lytic or sclerotic lesions. IMPRESSION: 1. Small pericardial effusion, new. 2. Interval improvement in patchy areas of ill-defined peribronchovascular nodularity and nodular consolidation in the lungs, indicative of an infectious bronchiolitis/bronchopneumonia, without complete resolution. 3. Splenic heterogeneity,  increased from 07/16/2017, possibly due to phase of contrast enhancement. HIV involvement is not excluded. Electronically Signed   By: Lorin Picket M.D.   On: 08/03/2017 07:31   Ct Abdomen Pelvis W Contrast  Result Date: 08/03/2017 CLINICAL DATA:  New diagnosis of HIV, cough and shortness of breath with fever and chills. Right-sided chest pain. Night sweats. Weight loss. EXAM: CT CHEST, ABDOMEN, AND PELVIS WITH CONTRAST TECHNIQUE: Multidetector CT imaging of the chest, abdomen and pelvis was performed following the standard protocol during bolus administration of intravenous contrast. CONTRAST:  146m ISOVUE-300 IOPAMIDOL (ISOVUE-300) INJECTION 61% COMPARISON:  07/16/2017 CT chest. 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 Vascular structures are otherwise unremarkable. Heart size normal. Small  amount of pericardial fluid, increased/new from 07/16/2017. Mediastinum/Nodes: No pathologically enlarged mediastinal, hilar or right axillary lymph nodes. Left axillary lymph nodes are not considered enlarged by CT size criteria. Esophagus is grossly unremarkable. Lungs/Pleura: Interval improvement in patchy areas of ill-defined peribronchovascular nodularity and nodular consolidation, without complete resolution. Scattered volume loss and mild bronchiectasis in the right middle lobe, lingula and left lower lobe. No pleural fluid. Airway is unremarkable. Musculoskeletal: No worrisome lytic or sclerotic lesions. CT ABDOMEN PELVIS FINDINGS Hepatobiliary: Liver and gallbladder are unremarkable. No biliary ductal dilatation. Pancreas: Negative. Spleen: Marked parenchymal heterogeneity, increased from prior. Within normal limits for size, 11.9 cm. Adrenals/Urinary Tract: Adrenal glands and kidneys are unremarkable. Ureters are decompressed. Bladder is grossly unremarkable. Stomach/Bowel: Stomach, small bowel, appendix and colon are unremarkable. Vascular/Lymphatic: Scattered lymph nodes are subcentimeter in short axis size. Gastrohepatic ligament lymph node measures 8 mm. Reproductive: Prostate is normal in size. Other: No free fluid.  Mesenteries and peritoneum are unremarkable. Musculoskeletal: No worrisome lytic or sclerotic lesions. IMPRESSION: 1. Small pericardial effusion, new. 2. Interval improvement in patchy areas of ill-defined peribronchovascular nodularity and nodular consolidation in the lungs, indicative of an infectious bronchiolitis/bronchopneumonia, without complete resolution. 3. Splenic heterogeneity, increased from 07/16/2017, possibly due to phase of contrast enhancement. HIV involvement is not excluded. Electronically Signed   By: MLorin PicketM.D.   On: 08/03/2017 07:31   Ct Biopsy  Result Date: 08/06/2017 INDICATION: 26year old with HIV and pancytopenia. Additional core biopsy has been  requested for cultures. EXAM: CT GUIDED BONE MARROW ASPIRATES AND BIOPSY Physician: AStephan Minister HAnselm Pancoast MD MEDICATIONS: None. ANESTHESIA/SEDATION: Fentanyl 50 mcg IV; Versed 2.0 mg IV Moderate Sedation Time:  35 minutes The patient was continuously monitored during the procedure by the interventional radiology nurse under my direct supervision. COMPLICATIONS: None immediate. PROCEDURE: The procedure was explained to the patient. The risks and benefits of the procedure were discussed and the patient's questions were addressed. Informed consent was obtained from the patient. The patient was placed prone on CT scan. Images of the pelvis were obtained. The left side of back was prepped and draped in sterile fashion. The skin and left posterior iliac bone were anesthetized with 1% lidocaine. 11 gauge bone needle was directed into the left iliac bone with CT guidance. Three aspirates and three core biopsies performed. One core biopsy was placed in saline as requested for culture. Bandage placed over the puncture site. FINDINGS: Bone needle directed into the left posterior ilium. Again noted is a small sclerotic density the posterior right ilium which probably represents a bone island. IMPRESSION: CT guided bone marrow aspirates and core biopsy. Electronically Signed   By: AMarkus DaftM.D.   On: 08/06/2017 11:41   Dg Abdomen Acute W/chest  Result Date: 07/31/2017 CLINICAL DATA:  Recent gagging episodes EXAM: DG ABDOMEN ACUTE W/ 1V CHEST COMPARISON:  07/26/2017 FINDINGS: Cardiac shadow is stable. Previously seen basilar infiltrates have resolved in the interval. No new focal infiltrate is seen. Scattered large and small bowel gas is noted. No obstructive changes are seen. No abnormal mass or abnormal calcifications are seen. No acute bony abnormality is noted. IMPRESSION: No acute abnormality seen. Electronically Signed   By: Inez Catalina M.D.   On: 07/31/2017 21:53     Assessment and plan- Patient is a 26 y.o. male who  was recently discharged from hospital for acute leukopenia (neutropenia and lymphopenia) and anemia. He has a recent diagnosis of acute HIV and PCP pneumonia. The bone marrow biopsy was negative and cultures are pending. His wbc count has dropped to 2.6 but his neutrophil count and lymphocyte count have improved. With a normal bone marrow the neutropenia and lymphopenia are likely related to immunosuppression in the setting of HIV and negative for malignancy.   His anemia has continued to improve and I suspect this will continue with increased control of his HIV. He has significantly less pallor than the last time I saw him and his energy improvement is apparent.  He continues to take oral iron supplements. He is not microcytic today but I will plan to check iron studies in 2 months. We can consider blood transfusions if his hemoglobin drops to less than 7.   We will plan to see him back for labs only in 1 month and then repeat labs in 2 months and have him see me again for follow-up.     Visit Diagnosis 1. HIV (human immunodeficiency virus infection) ([place])   2. Other pancytopenia ([place])     Beckey Rutter, NP  08/28/17 12:38 PM  Dr. Randa Evens, MD, MPH [place] [facility] Pager- [phone] 08/28/2017 12:38 PM

## 2017-08-28 NOTE — Progress Notes (Signed)
Here for follow up. Stated feeling better w more energy.

## 2017-08-29 ENCOUNTER — Inpatient Hospital Stay: Payer: Self-pay | Admitting: Oncology

## 2017-09-03 LAB — ACID FAST CULTURE WITH REFLEXED SENSITIVITIES (MYCOBACTERIA): Acid Fast Culture: NEGATIVE

## 2017-09-09 ENCOUNTER — Encounter (HOSPITAL_COMMUNITY): Payer: Self-pay

## 2017-09-13 LAB — CHROMOSOME ANALYSIS, BONE MARROW

## 2017-09-19 LAB — ACID FAST CULTURE WITH REFLEXED SENSITIVITIES: ACID FAST CULTURE - AFSCU3: NEGATIVE

## 2017-09-19 LAB — MISC LABCORP TEST (SEND OUT)
LABCORP TEST CODE: 183753
Labcorp test code: 183753

## 2017-09-26 ENCOUNTER — Ambulatory Visit: Payer: Self-pay | Admitting: Oncology

## 2017-09-26 ENCOUNTER — Other Ambulatory Visit: Payer: Self-pay

## 2017-09-27 ENCOUNTER — Ambulatory Visit: Payer: Self-pay | Admitting: Oncology

## 2017-09-27 ENCOUNTER — Inpatient Hospital Stay: Payer: Self-pay | Attending: Oncology

## 2017-09-27 DIAGNOSIS — B20 Human immunodeficiency virus [HIV] disease: Secondary | ICD-10-CM | POA: Insufficient documentation

## 2017-09-27 DIAGNOSIS — D61818 Other pancytopenia: Secondary | ICD-10-CM | POA: Insufficient documentation

## 2017-09-27 LAB — CBC WITH DIFFERENTIAL/PLATELET
BASOS ABS: 0 10*3/uL (ref 0–0.1)
BASOS PCT: 1 %
EOS ABS: 0.2 10*3/uL (ref 0–0.7)
Eosinophils Relative: 5 %
HEMATOCRIT: 36.9 % — AB (ref 40.0–52.0)
Hemoglobin: 12.5 g/dL — ABNORMAL LOW (ref 13.0–18.0)
Lymphocytes Relative: 43 %
Lymphs Abs: 2 10*3/uL (ref 1.0–3.6)
MCH: 30.8 pg (ref 26.0–34.0)
MCHC: 33.8 g/dL (ref 32.0–36.0)
MCV: 90.9 fL (ref 80.0–100.0)
MONO ABS: 0.4 10*3/uL (ref 0.2–1.0)
Monocytes Relative: 8 %
NEUTROS ABS: 2 10*3/uL (ref 1.4–6.5)
NEUTROS PCT: 43 %
PLATELETS: 366 10*3/uL (ref 150–440)
RBC: 4.05 MIL/uL — ABNORMAL LOW (ref 4.40–5.90)
RDW: 18.7 % — AB (ref 11.5–14.5)
WBC: 4.7 10*3/uL (ref 3.8–10.6)

## 2017-10-29 ENCOUNTER — Ambulatory Visit: Payer: Self-pay | Admitting: Oncology

## 2017-10-29 ENCOUNTER — Other Ambulatory Visit: Payer: Self-pay

## 2017-11-01 ENCOUNTER — Encounter: Payer: Self-pay | Admitting: Oncology

## 2017-11-01 ENCOUNTER — Inpatient Hospital Stay: Payer: Self-pay | Attending: Oncology | Admitting: Oncology

## 2017-11-01 ENCOUNTER — Other Ambulatory Visit: Payer: Self-pay

## 2017-11-01 ENCOUNTER — Inpatient Hospital Stay: Payer: Self-pay

## 2017-11-01 VITALS — BP 144/78 | HR 81 | Temp 98.1°F | Resp 18 | Wt 178.0 lb

## 2017-11-01 DIAGNOSIS — Z8701 Personal history of pneumonia (recurrent): Secondary | ICD-10-CM | POA: Insufficient documentation

## 2017-11-01 DIAGNOSIS — D61818 Other pancytopenia: Secondary | ICD-10-CM | POA: Insufficient documentation

## 2017-11-01 DIAGNOSIS — K219 Gastro-esophageal reflux disease without esophagitis: Secondary | ICD-10-CM | POA: Insufficient documentation

## 2017-11-01 DIAGNOSIS — B20 Human immunodeficiency virus [HIV] disease: Secondary | ICD-10-CM | POA: Insufficient documentation

## 2017-11-01 DIAGNOSIS — F418 Other specified anxiety disorders: Secondary | ICD-10-CM | POA: Insufficient documentation

## 2017-11-01 DIAGNOSIS — Z79899 Other long term (current) drug therapy: Secondary | ICD-10-CM | POA: Insufficient documentation

## 2017-11-01 DIAGNOSIS — R61 Generalized hyperhidrosis: Secondary | ICD-10-CM | POA: Insufficient documentation

## 2017-11-01 LAB — CBC WITH DIFFERENTIAL/PLATELET
BASOS PCT: 0 %
Basophils Absolute: 0 10*3/uL (ref 0–0.1)
EOS ABS: 0.2 10*3/uL (ref 0–0.7)
EOS PCT: 7 %
HCT: 40.2 % (ref 40.0–52.0)
Hemoglobin: 13.4 g/dL (ref 13.0–18.0)
Lymphocytes Relative: 44 %
Lymphs Abs: 1.4 10*3/uL (ref 1.0–3.6)
MCH: 30.5 pg (ref 26.0–34.0)
MCHC: 33.5 g/dL (ref 32.0–36.0)
MCV: 91.3 fL (ref 80.0–100.0)
MONO ABS: 0.3 10*3/uL (ref 0.2–1.0)
MONOS PCT: 9 %
NEUTROS PCT: 40 %
Neutro Abs: 1.3 10*3/uL — ABNORMAL LOW (ref 1.4–6.5)
PLATELETS: 315 10*3/uL (ref 150–440)
RBC: 4.4 MIL/uL (ref 4.40–5.90)
RDW: 14.6 % — AB (ref 11.5–14.5)
WBC: 3.3 10*3/uL — ABNORMAL LOW (ref 3.8–10.6)

## 2017-11-01 LAB — IRON AND TIBC
IRON: 86 ug/dL (ref 45–182)
SATURATION RATIOS: 29 % (ref 17.9–39.5)
TIBC: 298 ug/dL (ref 250–450)
UIBC: 212 ug/dL

## 2017-11-01 LAB — FERRITIN: Ferritin: 217 ng/mL (ref 24–336)

## 2017-11-01 NOTE — Progress Notes (Signed)
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 He was treated with azithromycin a couple of months ago. On admission patient was found to have a white count of 3. CT scan of the chest showed multifocal pneumonia. No evidence of mediastinal hilar or axillary adenopathy. He was tested positive for HIV. He has already been seen by infectious disease who plans to start him on antiretroviral therapy soon after his acute episode of pneumonia improvesHe does have a history of ongoing night sweats and 20 pound weight loss. He is bisexual but does not know ifany of his sex partners have HIV. We have been consult for his anemia.  On 8/10 he was sent to the ED for tachycardia and fever and was found to have a lactic acid of 2.0. He was given levaquin, nystatin, bactrim and discharged. On 07/31/17, he returned to the ED for emesis x3 days. He was found to be septic r/t HCAP. He was admitted and started on Bactrim, levaquin, and azithromycin. Infectious disease was consulted and he started biktarvy for HIV. On 8/20, his hemoglobin was 6.9 and he was given 1 unit of packed rbcs.   On 07/26/17 his white count was 1.5, H&H 7.8/23.6 and a platelet count of 214. On 07/16/2017 his white count was 3.6 and on 07/17/2017 his white count was 4.3. Patient had a white count of 1.9 in May 2018 which eventually recovered between 3-4 the following couple of months. Ferritin levels were elevated at 2305. Iron studies  showed low iron saturation of 7%. B12 was normal and Folate was normal at 22. Hepatitis A antibody positive. No evidence of hep B infection. Testing for hep C was negative. Reticulocyte count low at 0.7% indicative of hypoproliferative anemia.   Results of bloodwork from 07/19/2017 were as follows: Peripheral flow cytometry showed no significant immunophenotyping abnormality. Haptoglobin and serum copper was within normal limits. Parvovirus B19 PCR was negative.   Bone marrow biopsy was performed on 08/06/17. Negative for malignancy. 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  Definitive viral cytopathic changes are not present.  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  Cytogenetic studies were unremarkable. Smear negative for AFB/fungal. Cultures negative   Interval history-he is doing well and remains compliant with his HAART therapy.  Denies any fatigue or other complaints.  ECOG PS- 0 Pain scale- 0   Review of systems- Review of Systems  Constitutional: Negative for chills, fever, malaise/fatigue and weight loss.  HENT: Negative for congestion, ear discharge and nosebleeds.   Eyes: Negative for blurred vision.  Respiratory: Negative for cough, hemoptysis, sputum production, shortness of breath and wheezing.   Cardiovascular: Negative for chest pain, palpitations, orthopnea and claudication.  Gastrointestinal: Negative for abdominal pain, blood in stool, constipation, diarrhea, heartburn, melena, nausea and vomiting.  Genitourinary: Negative for dysuria, flank pain, frequency, hematuria and urgency.  Musculoskeletal: Negative for back pain, joint pain  and myalgias.  Skin: Negative for rash.  Neurological: Negative for dizziness, tingling, focal weakness, seizures, weakness and headaches.  Endo/Heme/Allergies: Does not  bruise/bleed easily.  Psychiatric/Behavioral: Negative for depression and suicidal ideas. The patient does not have insomnia.       No Known Allergies   Past Medical History:  Diagnosis Date  . Anxiety   . Depression   . GERD (gastroesophageal reflux disease)   . Heart murmur   . Hepatitis A   . HIV (human immunodeficiency virus infection) ([place])      Past Surgical History:  Procedure Laterality Date  . APPENDECTOMY      Social History   Socioeconomic History  . Marital status: Single    Spouse name: Not on file  . Number of children: Not on file  . Years of education: Not on file  . Highest education level: Not on file  Social Needs  . Financial resource strain: Not on file  . Food insecurity - worry: Not on file  . Food insecurity - inability: Not on file  . Transportation needs - medical: Not on file  . Transportation needs - non-medical: Not on file  Occupational History  . Not on file  Tobacco Use  . Smoking status: Never Smoker  . Smokeless tobacco: Never Used  Substance and Sexual Activity  . Alcohol use: No  . Drug use: No  . Sexual activity: Not on file  Other Topics Concern  . Not on file  Social History Narrative  . Not on file    No family history on file.   Current Outpatient Medications:  .  bictegravir-emtricitabine-tenofovir AF (BIKTARVY) 50-200-25 MG TABS tablet, Take 1 tablet by mouth daily., Disp: 30 tablet, Rfl: 0 .  ferrous sulfate 325 (65 FE) MG tablet, Take 1 tablet (325 mg total) by mouth 2 (two) times daily with a meal., Disp: 30 tablet, Rfl: 3 .  metroNIDAZOLE (FLAGYL) 500 MG tablet, Take 1 tablet (500 mg total) by mouth every 8 (eight) hours. (Patient not taking: Reported on 08/28/2017), Disp: 9 tablet, Rfl: 0 .  nystatin (MYCOSTATIN) 100000 UNIT/ML suspension, Take 5 mLs (500,000 Units total) by mouth 4 (four) times daily. (Patient not taking: Reported on 08/28/2017), Disp: 200 mL, Rfl: 0 .  ondansetron (ZOFRAN ODT) 4 MG  disintegrating tablet, Take 1 tablet (4 mg total) by mouth every 8 (eight) hours as needed for nausea or vomiting. (Patient not taking: Reported on 08/28/2017), Disp: 20 tablet, Rfl: 0 .  senna-docusate (SENOKOT-S) 8.6-50 MG tablet, Take 2 tablets by mouth 2 (two) times daily. (Patient not taking: Reported on 08/28/2017), Disp: 120 tablet, Rfl: 0 .  sulfamethoxazole-trimethoprim (BACTRIM,SEPTRA) 400-80 MG tablet, Take 1 tablet by mouth daily., Disp: 30 tablet, Rfl: 0 .  VENTOLIN HFA 108 (90 Base) MCG/ACT inhaler, Inhale 2 puffs into the lungs every 4 (four) hours as needed., Disp: , Rfl: 1  Physical exam:  Vitals:   11/01/17 0931  BP: (!) 144/78  Pulse: 81  Resp: 18  Temp: 98.1 F (36.7 C)  TempSrc: Tympanic  Weight: 178 lb (80.7 kg)   Physical Exam  Constitutional: He is oriented to person, place, and time and well-developed, well-nourished, and in no distress.  HENT:  Head: Normocephalic and atraumatic.  Mouth/Throat: Oropharynx is clear and moist.  Eyes: EOM are normal. Pupils are equal, round, and reactive to light.  Neck: Normal range of motion.  Cardiovascular: Normal rate, regular rhythm and normal heart sounds.  Pulmonary/Chest: Effort normal and breath sounds  normal.  Abdominal: Soft. Bowel sounds are normal.  Neurological: He is alert and oriented to person, place, and time.  Skin: Skin is warm and dry.     CMP Latest Ref Rng & Units 08/20/2017  Glucose 65 - 99 mg/dL 102(H)  BUN 6 - 20 mg/dL 8  Creatinine 0.61 - 1.24 mg/dL 0.65  Sodium 135 - 145 mmol/L 135  Potassium 3.5 - 5.1 mmol/L 4.2  Chloride 101 - 111 mmol/L 101  CO2 22 - 32 mmol/L 29  Calcium 8.9 - 10.3 mg/dL 8.3(L)  Total Protein 6.5 - 8.1 g/dL 5.9(L)  Total Bilirubin 0.3 - 1.2 mg/dL 0.4  Alkaline Phos 38 - 126 U/L 72  AST 15 - 41 U/L 24  ALT 17 - 63 U/L 16(L)   CBC Latest Ref Rng & Units 11/01/2017  WBC 3.8 - 10.6 K/uL 3.3(L)  Hemoglobin 13.0 - 18.0 g/dL 13.4  Hematocrit 40.0 - 52.0 % 40.2  Platelets  150 - 440 K/uL 315     Assessment and plan- Patient is a 26 y.o. 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  His white count waxes and wanes but overall significantly improved over the last 3 months and is up from 1-3.3 today ANC at 1.3.  At this time he can continue to follow-up with ID who can monitor his CBC.  I will see him back in 6 months time with a CBC with differential but if ID is comfortable monitoring his CBC he does not need to follow-up with hematology at this time.  He will discuss this with Dr. Ola Spurr during his next visit     Visit Diagnosis 1. HIV (human immunodeficiency virus infection) ([place])   2. Other pancytopenia ([place])      Dr. Randa Evens, MD, MPH [place] [facility] Pager- [phone] 11/01/2017 8:38 AM

## 2017-11-01 NOTE — Progress Notes (Signed)
Here for follow up. Stated doing well. Given counselling information as he voiced concerns w acceptance of DX and " social  norms "

## 2018-05-02 ENCOUNTER — Ambulatory Visit: Payer: Self-pay | Admitting: Oncology

## 2018-05-02 ENCOUNTER — Other Ambulatory Visit: Payer: Self-pay

## 2018-05-19 IMAGING — CR DG CHEST 2V
1 series · 2 of 2 positions shown · non-contrast
Comparison: CT chest x-ray 07/16/2017.

CLINICAL DATA: History of pneumonia.  Shortness of breath .

EXAM:
CHEST  2 VIEW

[Series 1: dg chest 2 view · 0.14mm/px · 2 of 2 slices shown]
[im 1/2]
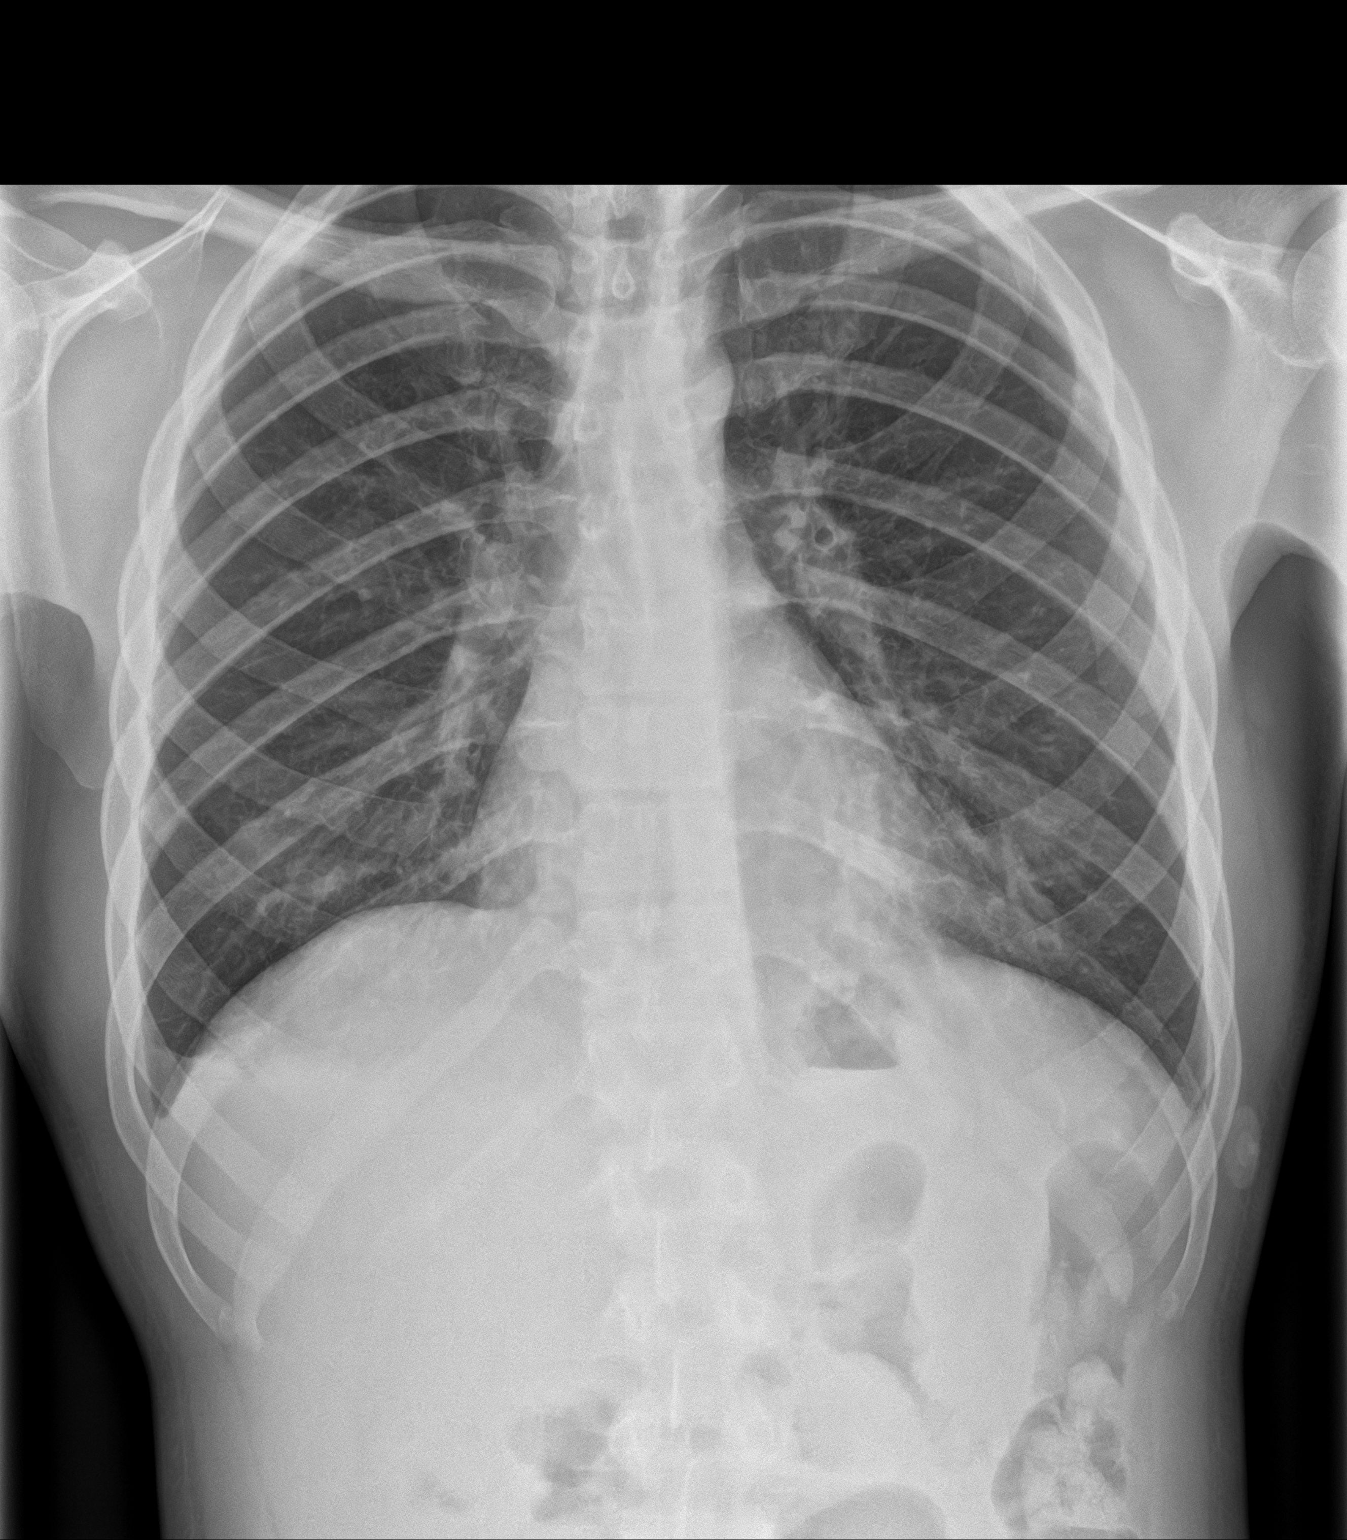
[im 2/2]
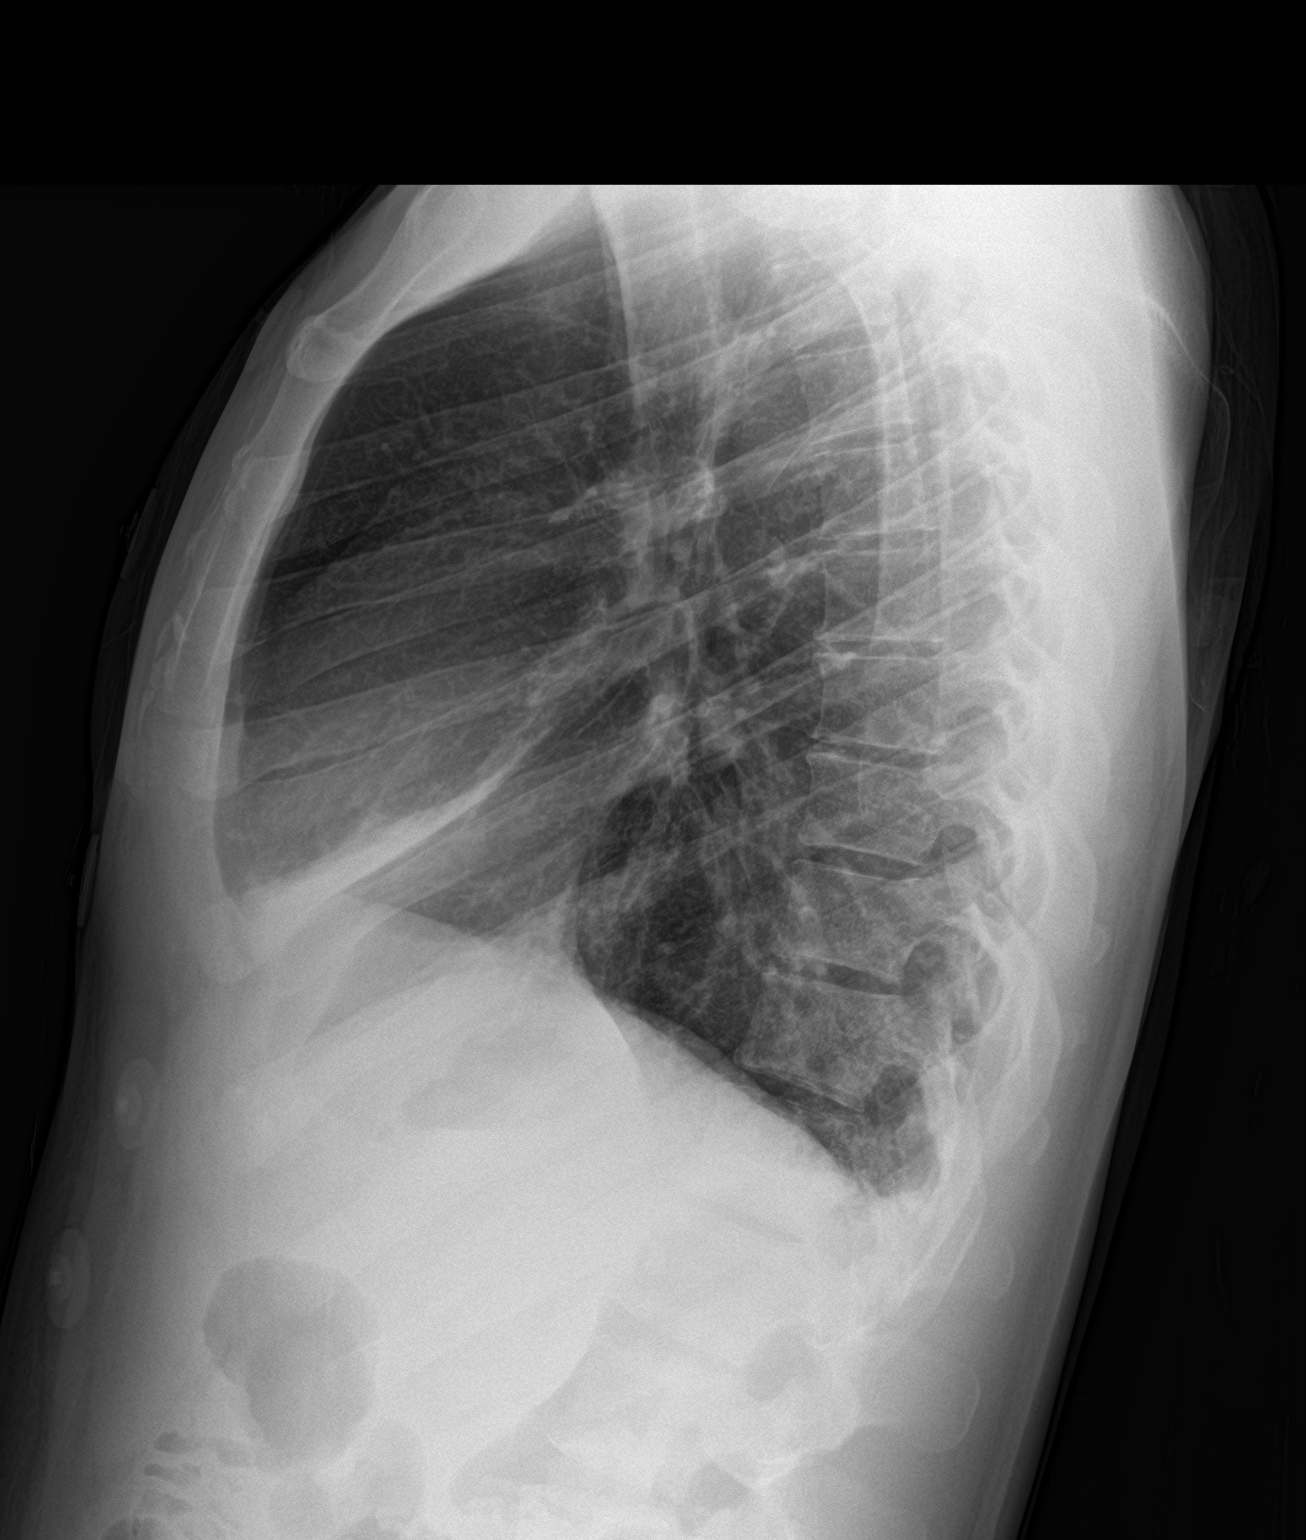

[2 of 2 positions shown; findings below may reference images not displayed]

FINDINGS: Mediastinum and hilar structures normal. Heart size stable.
Persistent multifocal multinodular infiltrates are noted. No interim
change. No pleural effusion or pneumothorax. No acute bony
abnormality.
IMPRESSION: Persistent multifocal multinodular infiltrates. No change from prior
exam.

## 2018-05-20 ENCOUNTER — Inpatient Hospital Stay: Payer: MEDICAID

## 2018-05-20 ENCOUNTER — Inpatient Hospital Stay: Payer: MEDICAID | Admitting: Oncology

## 2018-05-21 ENCOUNTER — Telehealth: Payer: Self-pay | Admitting: Oncology

## 2018-05-21 NOTE — Telephone Encounter (Signed)
Patient No Show Lab/MD appts on 05/20/18. Called patient to rschd appt. L/M on V/M. MD and care team notified.

## 2018-05-31 IMAGING — DX DG ABDOMEN ACUTE W/ 1V CHEST
3 series · 3 of 3 positions shown · non-contrast
Comparison: 07/26/2017

CLINICAL DATA: Recent gagging episodes

EXAM:
DG ABDOMEN ACUTE W/ 1V CHEST

[chest pa]
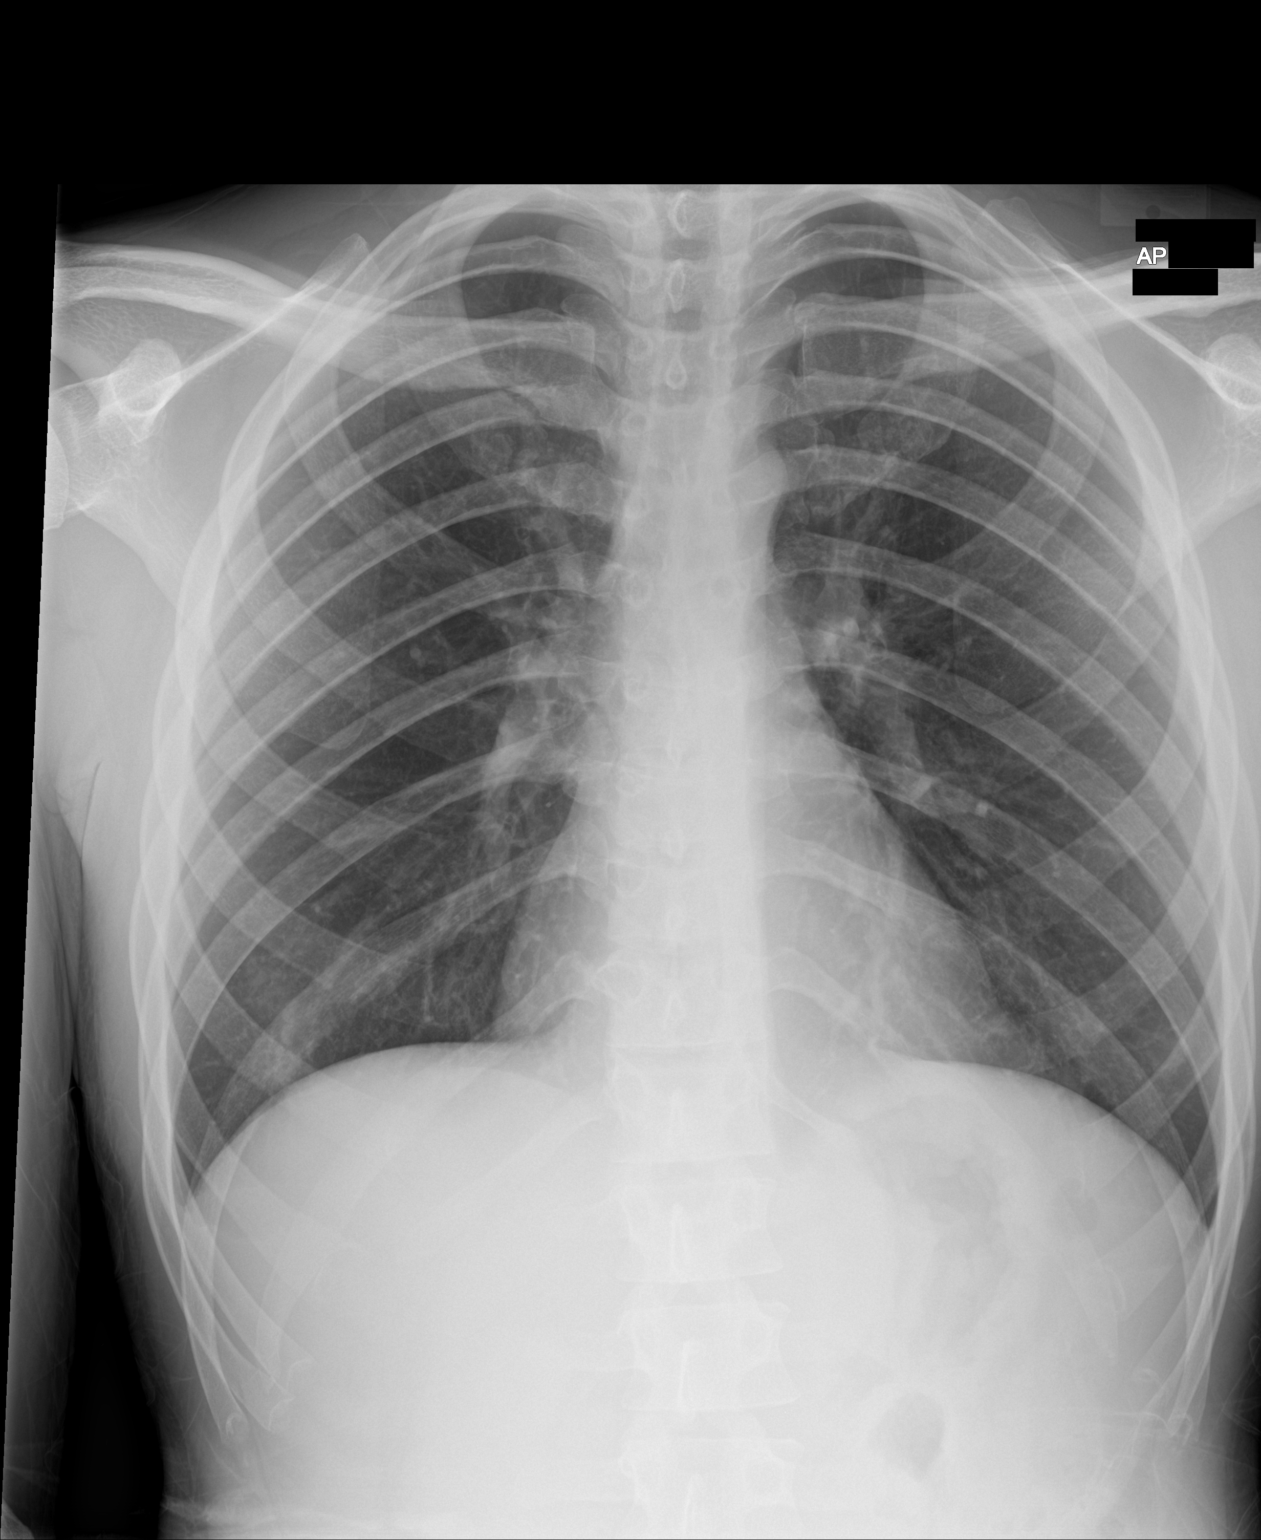

[abdomen erect]
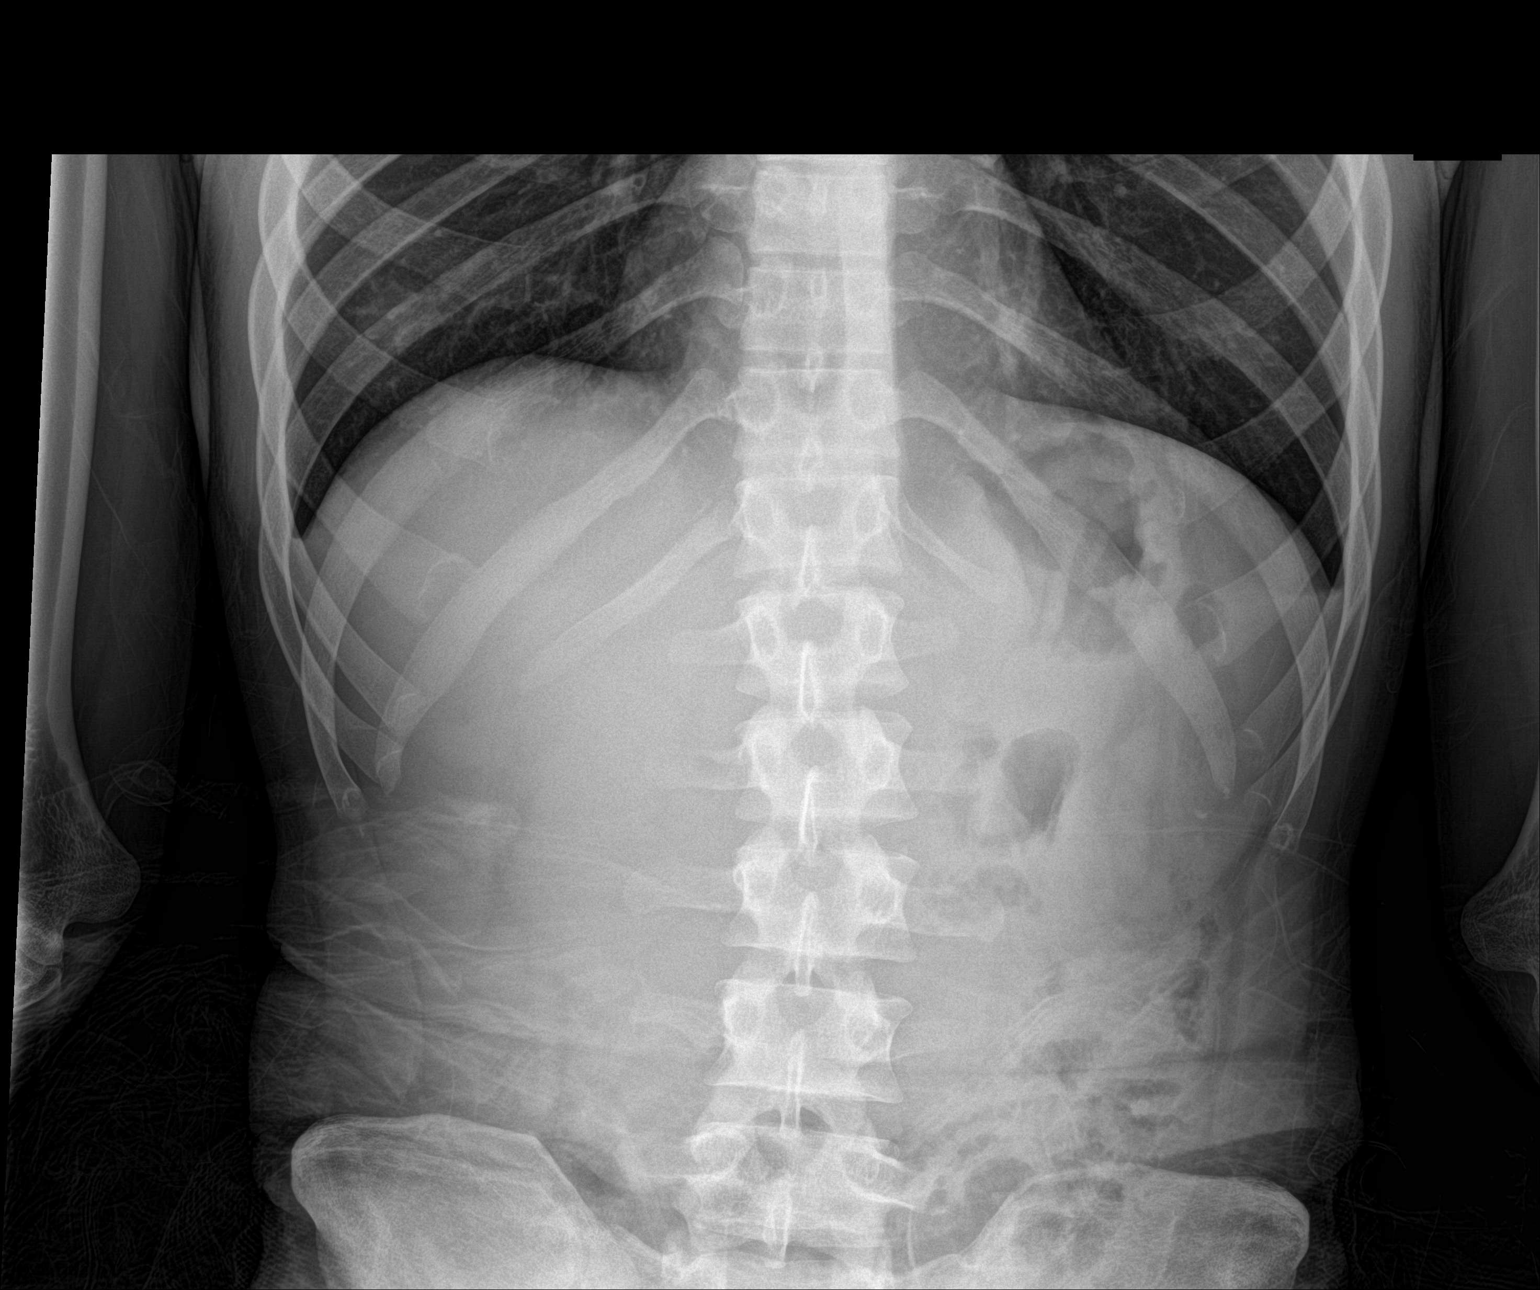

[abdomen supine]
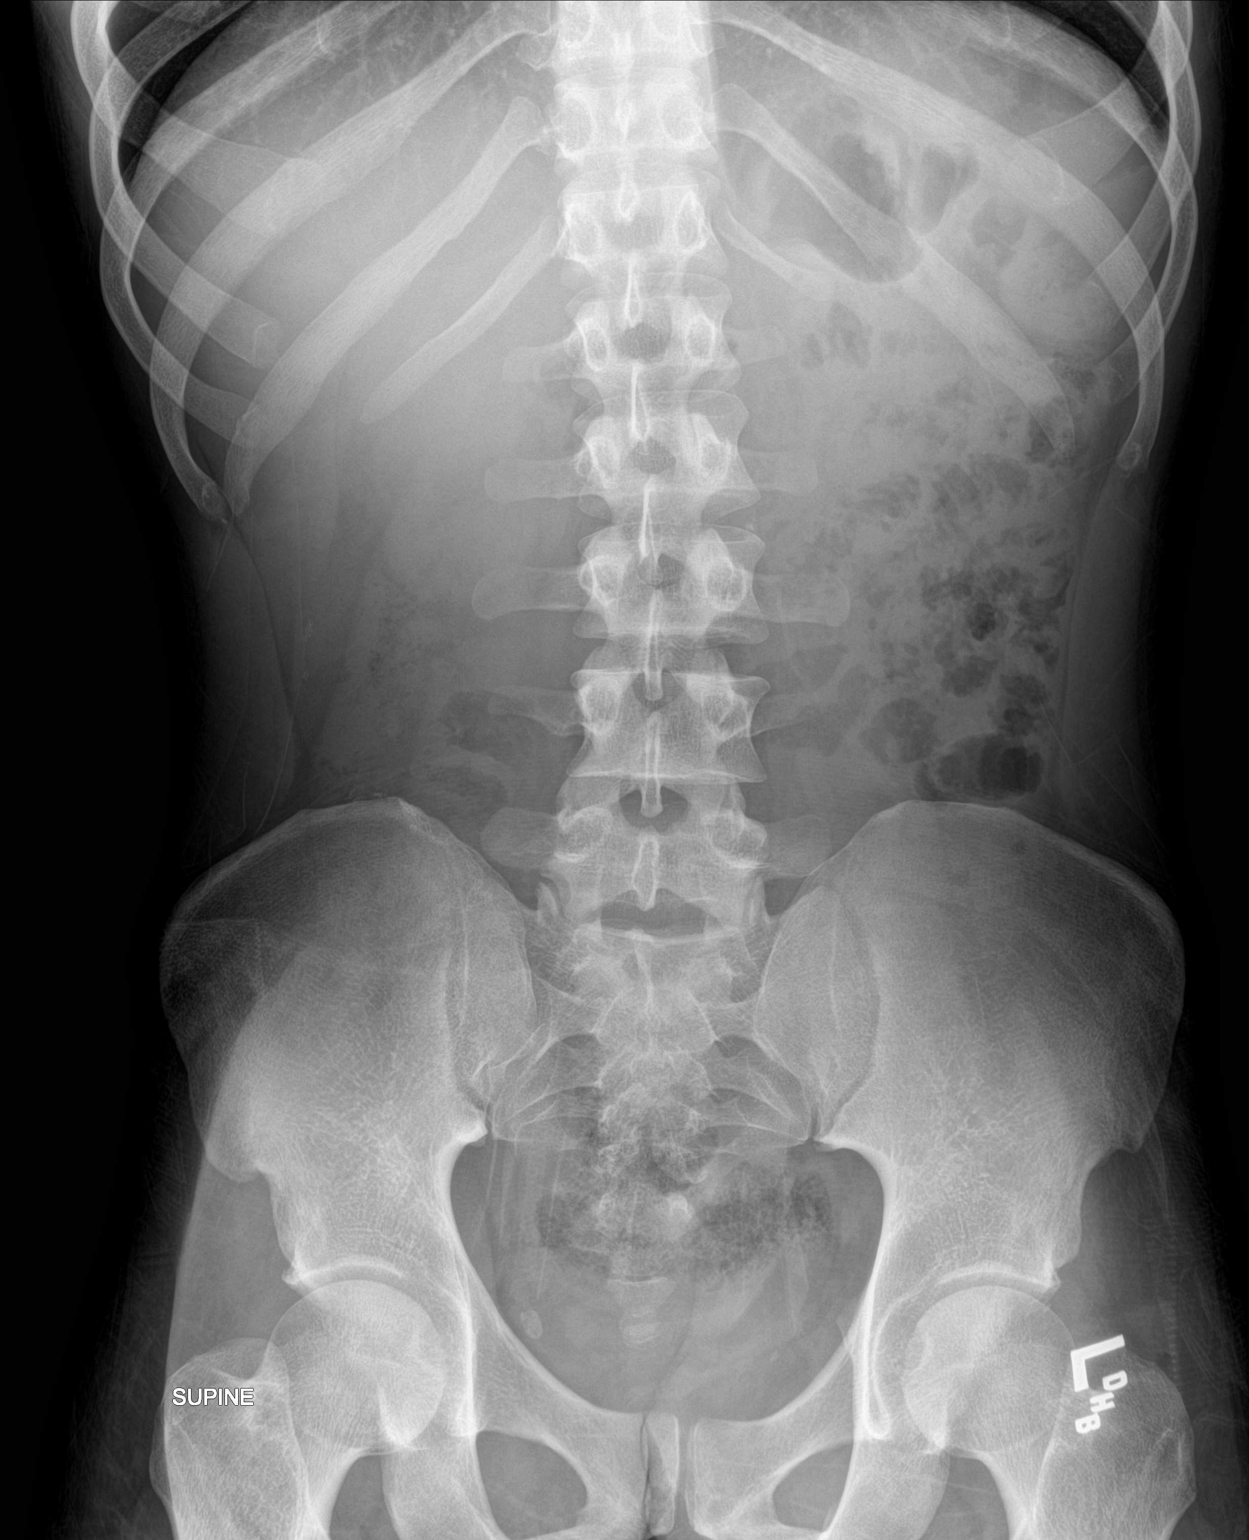

[3 of 3 positions shown; findings below may reference images not displayed]

FINDINGS: Cardiac shadow is stable. Previously seen basilar infiltrates have
resolved in the interval. No new focal infiltrate is seen.

Scattered large and small bowel gas is noted. No obstructive changes
are seen. No abnormal mass or abnormal calcifications are seen. No
acute bony abnormality is noted.
IMPRESSION: No acute abnormality seen.
